# Patient Record
Sex: Female | Born: 1975 | Hispanic: No | Marital: Married | Smoking: Never smoker
Health system: Southern US, Community
[De-identification: ages and names within clinical notes are randomized; demographics above are authoritative.]

## PROBLEM LIST (undated history)

## (undated) ENCOUNTER — Ambulatory Visit: Discharge status: Absent without leave

## (undated) DIAGNOSIS — G43909 Migraine, unspecified, not intractable, without status migrainosus: Secondary | ICD-10-CM

## (undated) HISTORY — DX: Migraine, unspecified, not intractable, without status migrainosus: G43.909

---

## 2005-12-28 ENCOUNTER — Emergency Department (HOSPITAL_COMMUNITY): Admission: EM | Admit: 2005-12-28 | Discharge: 2005-12-28 | Payer: Self-pay | Admitting: Emergency Medicine

## 2010-01-26 HISTORY — PX: LIPOSUCTION: SHX10

## 2010-11-30 ENCOUNTER — Ambulatory Visit: Payer: PRIVATE HEALTH INSURANCE | Admitting: *Deleted

## 2010-11-30 VITALS — BP 117/79 | Wt 145.0 lb

## 2010-11-30 DIAGNOSIS — O219 Vomiting of pregnancy, unspecified: Secondary | ICD-10-CM

## 2010-11-30 DIAGNOSIS — O3680X0 Pregnancy with inconclusive fetal viability, not applicable or unspecified: Secondary | ICD-10-CM

## 2010-11-30 MED ORDER — ONDANSETRON HCL 4 MG PO TABS: 4.0000 mg | ORAL_TABLET | Freq: Three times a day (TID) | ORAL | Status: AC | PRN

## 2010-11-30 NOTE — Patient Instructions (Signed)
Go to scheduled ultrasound and follow up afterwards in our office for new ob appointment.

## 2010-11-30 NOTE — Progress Notes (Signed)
Patient presents approximately [redacted] weeks pregnant according to bedside ultrasound.  She had had a small amount of spotting in August and did not think that this was her period.  Her last normal cycle was in July.  [facility].  There was a positve fetal heart rate on u/s today in the office.  Patient was given information on first trimester screening and given information about her upcoming visits.  She will follow up after the u/s for her New OB visit.

## 2010-12-01 LAB — OBSTETRIC PANEL
Basophils Relative: 0 % (ref 0–1)
Hemoglobin: 12.9 g/dL (ref 12.0–15.0)
Hepatitis B Surface Ag: NEGATIVE
Lymphocytes Relative: 27 % (ref 12–46)
Lymphs Abs: 2.6 10*3/uL (ref 0.7–4.0)
Monocytes Relative: 5 % (ref 3–12)
Neutro Abs: 6.5 10*3/uL (ref 1.7–7.7)
Neutrophils Relative %: 68 % (ref 43–77)
RBC: 4.48 MIL/uL (ref 3.87–5.11)
Rh Type: POSITIVE
Rubella: 92.6 IU/mL — ABNORMAL HIGH
WBC: 9.7 10*3/uL (ref 4.0–10.5)

## 2010-12-01 LAB — SICKLE CELL SCREEN: Sickle Cell Screen: NEGATIVE

## 2010-12-07 ENCOUNTER — Ambulatory Visit (HOSPITAL_COMMUNITY): Payer: PRIVATE HEALTH INSURANCE

## 2010-12-07 ENCOUNTER — Ambulatory Visit (HOSPITAL_COMMUNITY)
Admission: RE | Admit: 2010-12-07 | Discharge: 2010-12-07 | Disposition: A | Discharge status: Home / Self Care | Payer: Non-veteran care | Source: Ambulatory Visit | Attending: Obstetrics & Gynecology | Admitting: Obstetrics & Gynecology

## 2010-12-07 DIAGNOSIS — O3680X0 Pregnancy with inconclusive fetal viability, not applicable or unspecified: Secondary | ICD-10-CM

## 2010-12-07 DIAGNOSIS — Z3689 Encounter for other specified antenatal screening: Secondary | ICD-10-CM | POA: Insufficient documentation

## 2010-12-13 ENCOUNTER — Ambulatory Visit (INDEPENDENT_AMBULATORY_CARE_PROVIDER_SITE_OTHER): Payer: PRIVATE HEALTH INSURANCE | Admitting: Family Medicine

## 2010-12-13 ENCOUNTER — Encounter: Payer: Self-pay | Admitting: Family Medicine

## 2010-12-13 DIAGNOSIS — Z1272 Encounter for screening for malignant neoplasm of vagina: Secondary | ICD-10-CM

## 2010-12-13 DIAGNOSIS — O09529 Supervision of elderly multigravida, unspecified trimester: Secondary | ICD-10-CM

## 2010-12-13 DIAGNOSIS — Z113 Encounter for screening for infections with a predominantly sexual mode of transmission: Secondary | ICD-10-CM

## 2010-12-13 DIAGNOSIS — Z3480 Encounter for supervision of other normal pregnancy, unspecified trimester: Secondary | ICD-10-CM | POA: Insufficient documentation

## 2010-12-13 DIAGNOSIS — IMO0002 Reserved for concepts with insufficient information to code with codable children: Secondary | ICD-10-CM

## 2010-12-13 DIAGNOSIS — Z3400 Encounter for supervision of normal first pregnancy, unspecified trimester: Secondary | ICD-10-CM

## 2010-12-13 MED ORDER — PROMETHAZINE HCL 25 MG PO TABS: 25.0000 mg | ORAL_TABLET | Freq: Four times a day (QID) | ORAL | Status: DC | PRN

## 2010-12-13 NOTE — Patient Instructions (Signed)
Pregnancy - First Trimester During sexual intercourse, millions of sperm go into the vagina. Only 1 sperm will penetrate and fertilize the female egg while it is in the Fallopian tube. One week later, the fertilized egg implants into the wall of the uterus. An embryo begins to develop into a baby. At 6 to 8 weeks, the eyes and face are formed and the heartbeat can be seen on ultrasound. At the end of 12 weeks (first trimester), all the baby's organs are formed. Now that you are pregnant, you will want to do everything you can to have a healthy baby. Two of the most important things are to get good prenatal care and follow your caregiver's instructions. Prenatal care is all the medical care you receive before the baby's birth. It is given to prevent, find and treat problems during the pregnancy and childbirth. PRENATAL EXAMS:  During prenatal visits, your weight, blood pressure and urine are checked. This is done to make sure you are healthy and progressing normally during the pregnancy.   A pregnant woman should gain 25 to 35 pounds during the pregnancy. However, if you are over weight or underweight, your caregiver will advise you regarding your weight.   Your caregiver will ask and answer questions for you.   Blood work, cervical cultures, other necessary tests and a Pap test are done during your prenatal exams. These tests are done to check on your health and the probable health of your baby. Tests are strongly recommended and done for HIV with your permission. This is the virus that causes AIDS. These tests are done because medications can be given to help prevent your baby from being born with this infection should you have been infected without knowing it. Blood work is also used to find out your blood type, previous infections and follow your blood levels (hemoglobin).   Low hemoglobin (anemia) is common during pregnancy. Iron and vitamins are given to help prevent this. Later in the pregnancy,  blood tests for diabetes will be done along with any other tests if any problems develop. You may need tests to make sure you and the baby are doing well.   You may need other tests to make sure you and the baby are doing well.  CHANGES DURING THE FIRST TRIMESTER (THE FIRST 3 MONTHS OF PREGNANCY) Your body goes through many changes during pregnancy. They vary from person to person. Talk to your caregiver about changes you notice and are concerned about. Changes can include:  Your menstrual period stops.   The egg and sperm carry the genes that determine what you look like. Genes from you and your partner are forming a baby. The female genes determine whether the baby is a boy or a girl.   Your body increases in girth and you may feel bloated.   Feeling sick to your stomach (nauseous) and throwing up (vomiting). If the vomiting is uncontrollable, call your caregiver.   Your breasts will begin to enlarge and become tender.   Your nipples may stick out more and become darker.   The need to urinate more. Painful urination may mean you have a bladder infection.   Tiring easily.   Loss of appetite.   Cravings for certain kinds of food.   At first, you may gain or lose a couple of pounds.   You may have changes in your emotions from day to day (excited to be pregnant or concerned something may go wrong with the pregnancy and baby).     You may have more vivid and strange dreams.  HOME CARE INSTRUCTIONS  It is very important to avoid all smoking, alcohol and un-prescribed drugs during your pregnancy. These affect the formation and growth of the baby. Avoid chemicals while pregnant to ensure the delivery of a healthy infant.   Start your prenatal visits by the 12th week of pregnancy. They are usually scheduled monthly at first, then more often in the last 2 months before delivery. Keep your caregiver's appointments. Follow your caregiver's instructions regarding medication use, blood and lab  tests, exercise, and diet.   During pregnancy, you are providing food for you and your baby. Eat regular, well-balanced meals. Choose foods such as meat, fish, milk and other low fat dairy products, vegetables, fruits, and whole-grain breads and cereals. Your caregiver will tell you of the ideal weight gain.   You can help morning sickness by keeping soda crackers (saltines) at the bedside. Eat a couple before arising in the morning. You may want to use the crackers without salt on them.   Eating 4 to 5 small meals rather than 3 large meals a day also may help the nausea and vomiting.   Drinking liquids between meals instead of during meals also seems to help nausea and vomiting.   A physical sexual relationship may be continued throughout pregnancy if there are no other problems. Problems may be early (premature) leaking of amniotic fluid from the membranes, vaginal bleeding, or belly (abdominal) pain.   Exercise regularly if there are no restrictions. Check with your caregiver or physical therapist if you are unsure of the safety of some of your exercises. Greater weight gain will occur in the last 2 trimesters of pregnancy. Exercising will help:   Control your weight.   Keep you in shape.   Prepare you for labor and delivery.   Help you lose your pregnancy weight after you deliver your baby.   Wear a good support or jogging bra for breast tenderness during pregnancy. This may help if worn during sleep too.   Ask when prenatal classes are available. Begin classes when they are offered.   Do not use hot tubs, steam rooms or saunas.   Wear your seat belt when driving. This protects you and your baby if you are in an accident.   Avoid raw meat, uncooked cheese, cat litter boxes and soil used by cats throughout the pregnancy. These carry germs that can cause birth defects in the baby.   The first trimester is a good time to visit your dentist for your dental health. Getting your teeth  cleaned is OK. Use a softer toothbrush and brush gently during pregnancy.   Ask for help if you have financial, counseling or nutritional needs during pregnancy. Your caregiver will be able to offer counseling for these needs as well as refer you for other special needs.   Do not take any medications or herbs unless told by your caregiver.   Inform your caregiver if there is any mental or physical domestic violence.   Make a list of emergency phone numbers of family, friends, hospital, police and fire department.   Write down your questions. Take them to your prenatal visit.   Do not douche.   Do not cross your legs.   If you have to stand for long periods of time, rotate you feet or take small steps in a circle.   You may have more vaginal secretions that may require a sanitary pad. Do not use tampons   or scented sanitary pads.  MEDICATIONS AND DRUG USE IN PREGNANCY  Take prenatal vitamins as directed. The vitamin should contain 1 milligram of folic acid. Keep all vitamins out of reach of children. Only a couple vitamins or tablets containing iron may be fatal to a baby or young child when ingested.   Avoid use of all medications, including herbs, over-the-counter medications, not prescribed or suggested by your caregiver. Only take over-the-counter or prescription medicines for pain, discomfort, or fever as directed by your caregiver. Do not use aspirin, ibuprofen (Motrin, Advil, Nuprin) or naproxen (Aleve) unless OK'd by your caregiver.   Let your caregiver also know about herbs you may be using.   Alcohol is related to a number of birth defects. This includes fetal alcohol syndrome. All alcohol, in any form, should be avoided completely. Smoking will cause low birth rate and premature babies.   Street/illegal drugs are very harmful to the baby. They are absolutely forbidden. A baby born to an addicted mother will be addicted at birth. The baby will go through the same withdrawal  an adult does.   Let your caregiver know about any medications that you have to take and for what reason you take them.  MISCARRIAGE IS COMMON DURING PREGNANCY A miscarriage does not mean you did something wrong. It is not a reason to worry about getting pregnant again. Your caregiver will help you with questions you may have. If you have a miscarriage, you may need minor surgery (a D & C). SEEK MEDICAL CARE IF:  You have any concerns or worries during your pregnancy. It is better to call with your questions if you feel they cannot wait, rather than worry about them.  SEEK IMMEDIATE MEDICAL CARE IF:  An unexplained oral temperature above 101 develops, or as your caregiver suggests.   You have leaking of fluid from the vagina (birth canal). If leaking membranes are suspected, take your temperature and inform your caregiver of this when you call.   There is vaginal spotting or bleeding. Notify your caregiver of the amount and how many pads are used.   You develop a bad smelling vaginal discharge with a change in the color.   You continue to feel sick to your stomach (nauseated) and have no relief from remedies suggested. You vomit blood or coffee ground like materials.   You lose more than 2 pounds of weight in one week.   You gain more than 2 pounds of weight in a week and you notice swelling of your face, hands, feet or legs.   You gain 5 pounds or more in 1 week (even if you do not have swelling of your hands, face, legs or feet).   You get exposed to German measles and have never had them.   You are exposed to fifth disease or chicken pox.   You develop belly (abdominal) pain. Round ligament discomfort is a common non-cancerous (benign) cause of abdominal pain in pregnancy. Your caregiver still must evaluate this.   You develop headache, fever, diarrhea, pain with urination, or shortness of breath.   You fall, are in a car accident or have any kind of trauma.   There is mental  or physical violence in your home.  Document Released: 02/06/2001 Document Re-Released: 08/02/2009 [employer] Patient Information 2011 [employer]. 

## 2010-12-13 NOTE — Progress Notes (Signed)
   Subjective:    Tanya Maxwell is a G2P0010 [redacted]w[redacted]d being seen today for her first obstetrical visit.  Her obstetrical history is significant for advanced maternal age. Patient does intend to breast feed. Pregnancy history fully reviewed.  Patient reports fatigue.  Filed Vitals:   12/13/10 0900  BP: 113/68  Weight: 144 lb (65.318 kg)    HISTORY: OB History    Grav Para Term Preterm Abortions TAB SAB Ect Mult Living   2 0 0 0 1 1 0 0 0 0      # Outc Date GA Lbr Len/2nd Wgt Sex Del Anes PTL Lv   1 TAB            2 CUR              Past Medical History  Diagnosis Date  . Migraines     since 1996   Past Surgical History  Procedure Date  . Liposuction 01/2010   Family History  Problem Relation Age of Onset  . Heart disease Mother   . Diabetes Mother      Exam    Uterine Size: size equals dates  Pelvic Exam:    Perineum: Normal Perineum   Vulva: normal   Vagina:  normal mucosa       Cervix: nulliparous appearance   Adnexa: normal adnexa   Bony Pelvis: gynecoid  System: Breast:  normal appearance, no masses or tenderness   Skin: normal coloration and turgor, no rashes    Neurologic: oriented, normal   Extremities: normal strength, tone, and muscle mass, no erythema, induration, or nodules   HEENT sclera clear, anicteric   Mouth/Teeth mucous membranes moist, pharynx normal without lesions   Neck supple   Cardiovascular: regular rate and rhythm   Respiratory:  appears well, vitals normal, no respiratory distress, acyanotic, normal RR, ear and throat exam is normal, neck free of mass or lymphadenopathy, chest clear, no wheezing, crepitations, rhonchi, normal symmetric air entry   Abdomen: soft, non-tender; bowel sounds normal; no masses,  no organomegaly   Urinary: urethral meatus normal      Assessment:    Pregnancy: G2P0010 Patient Active Problem List  Diagnoses  . Supervision of normal first pregnancy  . Advanced maternal age        Plan:       Initial labs drawn. Prenatal vitamins. Problem list reviewed and updated. Genetic Screening discussed First Screen: discussed.  Ultrasound discussed; fetal survey: ordered.  Follow up in 4 weeks.   Tanya Maxwell 12/13/2010 Considering first screen---will call back and let us know.

## 2010-12-13 NOTE — Progress Notes (Signed)
Addended by: Reva Bores on: 12/13/2010 11:58 AM   Modules accepted: Orders

## 2010-12-18 ENCOUNTER — Other Ambulatory Visit: Payer: Self-pay | Admitting: Obstetrics & Gynecology

## 2010-12-18 ENCOUNTER — Other Ambulatory Visit: Payer: PRIVATE HEALTH INSURANCE

## 2010-12-18 DIAGNOSIS — Z3682 Encounter for antenatal screening for nuchal translucency: Secondary | ICD-10-CM

## 2010-12-26 ENCOUNTER — Ambulatory Visit (INDEPENDENT_AMBULATORY_CARE_PROVIDER_SITE_OTHER): Payer: PRIVATE HEALTH INSURANCE | Admitting: Nurse Practitioner

## 2010-12-26 ENCOUNTER — Encounter: Payer: Self-pay | Admitting: Nurse Practitioner

## 2010-12-26 DIAGNOSIS — F419 Anxiety disorder, unspecified: Secondary | ICD-10-CM

## 2010-12-26 DIAGNOSIS — M542 Cervicalgia: Secondary | ICD-10-CM

## 2010-12-26 DIAGNOSIS — F411 Generalized anxiety disorder: Secondary | ICD-10-CM

## 2010-12-26 DIAGNOSIS — G43109 Migraine with aura, not intractable, without status migrainosus: Secondary | ICD-10-CM | POA: Insufficient documentation

## 2010-12-26 MED ORDER — SERTRALINE HCL 50 MG PO TABS: 50.0000 mg | ORAL_TABLET | Freq: Every day | ORAL | Status: DC

## 2010-12-26 MED ORDER — PROMETHAZINE HCL 25 MG PO TABS: 25.0000 mg | ORAL_TABLET | Freq: Four times a day (QID) | ORAL | Status: DC | PRN

## 2010-12-26 NOTE — Progress Notes (Signed)
Diagnosis:Migraine with Aura   History: Pt is in office today for evaluation of migraine headache. She is also pregnant [redacted] weeks and 1 day. G2 [id number]. She has had migraine for the last 16 years. Her aura is visual. It has been worse with BCPs in past. She does not feel it is any better or worse with pregnancy. She does have ADHD and insomnia. She also describes herself as multitasker and  Somewhat anxious. She has not been exercising lately. In past medications have included Goody Powders, Imitrex, Maxalt, Zomig, Topamax and Lexapro. 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  Location:Right and left temples, Occipital and neck  Number of Headache days/month: Severe:2-3 that last 2-3 days Moderate:included with 2-3 days Mild:0  Current Outpatient Prescriptions on File Prior to Visit  Medication Sig Dispense Refill  . Prenatal Vitamins (DIS) TABS Take 1 tablet by mouth daily.          Acute prevention:Goody Powders, Tylenol  Past Medical History  Diagnosis Date  . Migraines     since 1996   Past Surgical History  Procedure Date  . Liposuction 01/2010   Family History  Problem Relation Age of Onset  . Heart disease Mother   . Diabetes Mother    Social History:  reports that she has never smoked. She has never used smokeless tobacco. She reports that she does not drink alcohol or use illicit drugs. Allergies: No Known Allergies  Triggers:Stress, not eating , Artifical sweetners  Birth control:pregnancy  WUJ:[id number] for pregnancy, nausea, headache, anxiety  Exam:Well developed well nourished female  General:NAD HEENT:negative Cardiac:RRR Lungs:Clear Neuro:Negative Skin:Warm and dry Muscles: Tight in neck and Traps  Procedure: 4cc lidocaine, 4 cc marcaine, 2 cc dexamethazone. Injected with 1cc each site in Bil Occipital, Bil Temple and bilateral traps. Pt tolerated procedure well.    Impression:migraine - classic in pregnancy  Plan We discussed the risk and benefit ratio in using medications in pregnant women. She has agreed the risk outweight the risks for her. We will start Zoloft for her anxiety and hopefully that will help with that migraine trigger. We did Trigger Point Injections today and she is asked to ice tonight. We will refer her to [facility] for eval and treatment of headaches and muscle spasms in pregnancy. She is asked to exercise when she feels able. We will see her back in 3 weeks.  Time Spent:one hour

## 2011-01-01 ENCOUNTER — Ambulatory Visit (HOSPITAL_COMMUNITY)
Admission: RE | Admit: 2011-01-01 | Discharge: 2011-01-01 | Disposition: A | Discharge status: Home / Self Care | Payer: Non-veteran care | Source: Ambulatory Visit | Attending: Obstetrics & Gynecology | Admitting: Obstetrics & Gynecology

## 2011-01-01 ENCOUNTER — Other Ambulatory Visit: Payer: Self-pay

## 2011-01-01 DIAGNOSIS — O3510X0 Maternal care for (suspected) chromosomal abnormality in fetus, unspecified, not applicable or unspecified: Secondary | ICD-10-CM | POA: Insufficient documentation

## 2011-01-01 DIAGNOSIS — O09529 Supervision of elderly multigravida, unspecified trimester: Secondary | ICD-10-CM | POA: Insufficient documentation

## 2011-01-01 DIAGNOSIS — O351XX0 Maternal care for (suspected) chromosomal abnormality in fetus, not applicable or unspecified: Secondary | ICD-10-CM | POA: Insufficient documentation

## 2011-01-01 DIAGNOSIS — Z3689 Encounter for other specified antenatal screening: Secondary | ICD-10-CM | POA: Insufficient documentation

## 2011-01-01 DIAGNOSIS — Z3682 Encounter for antenatal screening for nuchal translucency: Secondary | ICD-10-CM

## 2011-01-09 ENCOUNTER — Encounter: Payer: Self-pay | Admitting: Nurse Practitioner

## 2011-01-09 ENCOUNTER — Ambulatory Visit (INDEPENDENT_AMBULATORY_CARE_PROVIDER_SITE_OTHER): Payer: PRIVATE HEALTH INSURANCE | Admitting: Nurse Practitioner

## 2011-01-09 VITALS — BP 108/66 | HR 71 | Wt 142.0 lb

## 2011-01-09 DIAGNOSIS — G43909 Migraine, unspecified, not intractable, without status migrainosus: Secondary | ICD-10-CM

## 2011-01-09 DIAGNOSIS — O2690 Pregnancy related conditions, unspecified, unspecified trimester: Secondary | ICD-10-CM

## 2011-01-09 MED ORDER — HYDROCODONE-ACETAMINOPHEN 7.5-750 MG PO TABS: 1.0000 | ORAL_TABLET | Freq: Four times a day (QID) | ORAL | Status: DC | PRN

## 2011-01-09 MED ORDER — SUMATRIPTAN SUCCINATE 100 MG PO TABS: 100.0000 mg | ORAL_TABLET | Freq: Once | ORAL | Status: DC | PRN

## 2011-01-09 NOTE — Progress Notes (Signed)
S: Pt returns today for follow up on migraine headaches in pregnancy. She is 13 weeks and 1 day pregnant. On her last office visit  12/26/10 we did trigger point injections which she did not feel were helpful. Her headache came back and lasted for 4 days. Now she feels her headaches are back to there usual pattern.  She is not sure if Zoloft has helped her anxiety. She has been doing limited exercise and had not been called by [facility]. She is using Phenergan 1/2 tab for sleep. She know she did better overall with her anxiety when she was on Ritalin.   O: Alert oriented, NAD HEENT neg Neuro : negative  A: migraine in pregnancy  P: We will not do further Trigger Point Injections We discussed taking Imitrex in Pregnancy and she is willing to assume risk/ discussed Pregnancy Registry She will be given # 30 [place] for rescue Call [facility] and get an appointment Discussed Yoga in pregnancy and other exercise that may help to calm her down Return 1 month or prn

## 2011-01-16 ENCOUNTER — Ambulatory Visit (INDEPENDENT_AMBULATORY_CARE_PROVIDER_SITE_OTHER): Payer: PRIVATE HEALTH INSURANCE | Admitting: Family Medicine

## 2011-01-16 DIAGNOSIS — Z3400 Encounter for supervision of normal first pregnancy, unspecified trimester: Secondary | ICD-10-CM

## 2011-01-16 NOTE — Progress Notes (Signed)
Nml First screen, schedule anatomy

## 2011-01-31 ENCOUNTER — Telehealth: Payer: Self-pay

## 2011-01-31 NOTE — Telephone Encounter (Signed)
PATIENT IS CALLING SHE IS NOW HAVING HEADACHES EVERYDAY AS OPPOSE TO ONCE IN A WHILE. THE PAIN IS CONSISTENT AND DOES NOT GO AWAY. IT IS THE SAME PAIN ALWAYS IT IS NOT DIFFERENT. SHE DOES NOT KNOW IF HER HEADACHES ARE WORSE DUE TO HER PREGNANCY BUT SHE IS HAVING THEM DAILY NOW. PLEASE LET ME KNOW WHAT TO TELL HER OR SEND IT TO A NURSE IF SHE NEEDS SOMETHING CALLED IN TO HER PHARMACY. [facility] THANKS!

## 2011-02-01 ENCOUNTER — Other Ambulatory Visit: Payer: Self-pay | Admitting: Gynecology

## 2011-02-01 DIAGNOSIS — F419 Anxiety disorder, unspecified: Secondary | ICD-10-CM

## 2011-02-01 MED ORDER — PREDNISONE (PAK) 10 MG PO TABS: 20.0000 mg | ORAL_TABLET | Freq: Every day | ORAL | Status: AC

## 2011-02-01 MED ORDER — SERTRALINE HCL 50 MG PO TABS: 100.0000 mg | ORAL_TABLET | Freq: Every day | ORAL | Status: DC

## 2011-02-06 ENCOUNTER — Telehealth: Payer: Self-pay

## 2011-02-06 DIAGNOSIS — F419 Anxiety disorder, unspecified: Secondary | ICD-10-CM

## 2011-02-06 NOTE — Telephone Encounter (Signed)
Patient needs [place] to send [facility] please, I can send it if you can print it out for me and provide the fax, thanks!

## 2011-02-13 ENCOUNTER — Ambulatory Visit (INDEPENDENT_AMBULATORY_CARE_PROVIDER_SITE_OTHER): Payer: PRIVATE HEALTH INSURANCE | Admitting: Advanced Practice Midwife

## 2011-02-13 ENCOUNTER — Encounter: Payer: PRIVATE HEALTH INSURANCE | Admitting: Nurse Practitioner

## 2011-02-13 ENCOUNTER — Encounter: Payer: Self-pay | Admitting: Advanced Practice Midwife

## 2011-02-13 VITALS — BP 108/64 | Wt 143.0 lb

## 2011-02-13 DIAGNOSIS — Z3480 Encounter for supervision of other normal pregnancy, unspecified trimester: Secondary | ICD-10-CM

## 2011-02-13 MED ORDER — SERTRALINE HCL 100 MG PO TABS: 100.0000 mg | ORAL_TABLET | Freq: Every day | ORAL | Status: DC

## 2011-02-13 NOTE — Patient Instructions (Signed)
Pregnancy - Second Trimester The second trimester of pregnancy (3 to 6 months) is a period of rapid growth for you and your baby. At the end of the sixth month, your baby is about 9 inches long and weighs 1 1/2 pounds. You will begin to feel the baby move between 18 and 20 weeks of the pregnancy. This is called quickening. Weight gain is faster. A clear fluid (colostrum) may leak out of your breasts. You may feel small contractions of the womb (uterus). This is known as false labor or Braxton-Hicks contractions. This is like a practice for labor when the baby is ready to be born. Usually, the problems with morning sickness have usually passed by the end of your first trimester. Some women develop small dark blotches (called cholasma, mask of pregnancy) on their face that usually goes away after the baby is born. Exposure to the sun makes the blotches worse. Acne may also develop in some pregnant women and pregnant women who have acne, may find that it goes away. PRENATAL EXAMS  Blood work may continue to be done during prenatal exams. These tests are done to check on your health and the probable health of your baby. Blood work is used to follow your blood levels (hemoglobin). Anemia (low hemoglobin) is common during pregnancy. Iron and vitamins are given to help prevent this. You will also be checked for diabetes between 24 and 28 weeks of the pregnancy. Some of the previous blood tests may be repeated.   The size of the uterus is measured during each visit. This is to make sure that the baby is continuing to grow properly according to the dates of the pregnancy.   Your blood pressure is checked every prenatal visit. This is to make sure you are not getting toxemia.   Your urine is checked to make sure you do not have an infection, diabetes or protein in the urine.   Your weight is checked often to make sure gains are happening at the suggested rate. This is to ensure that both you and your baby are  growing normally.   Sometimes, an ultrasound is performed to confirm the proper growth and development of the baby. This is a test which bounces harmless sound waves off the baby so your caregiver can more accurately determine due dates.  Sometimes, a specialized test is done on the amniotic fluid surrounding the baby. This test is called an amniocentesis. The amniotic fluid is obtained by sticking a needle into the belly (abdomen). This is done to check the chromosomes in instances where there is a concern about possible genetic problems with the baby. It is also sometimes done near the end of pregnancy if an early delivery is required. In this case, it is done to help make sure the baby's lungs are mature enough for the baby to live outside of the womb. CHANGES OCCURING IN THE SECOND TRIMESTER OF PREGNANCY Your body goes through many changes during pregnancy. They vary from person to person. Talk to your caregiver about changes you notice that you are concerned about.  During the second trimester, you will likely have an increase in your appetite. It is normal to have cravings for certain foods. This varies from person to person and pregnancy to pregnancy.   Your lower abdomen will begin to bulge.   You may have to urinate more often because the uterus and baby are pressing on your bladder. It is also common to get more bladder infections during pregnancy (  pain with urination). You can help this by drinking lots of fluids and emptying your bladder before and after intercourse.   You may begin to get stretch marks on your hips, abdomen, and breasts. These are normal changes in the body during pregnancy. There are no exercises or medications to take that prevent this change.   You may begin to develop swollen and bulging veins (varicose veins) in your legs. Wearing support hose, elevating your feet for 15 minutes, 3 to 4 times a day and limiting salt in your diet helps lessen the problem.    Heartburn may develop as the uterus grows and pushes up against the stomach. Antacids recommended by your caregiver helps with this problem. Also, eating smaller meals 4 to 5 times a day helps.   Constipation can be treated with a stool softener or adding bulk to your diet. Drinking lots of fluids, vegetables, fruits, and whole grains are helpful.   Exercising is also helpful. If you have been very active up until your pregnancy, most of these activities can be continued during your pregnancy. If you have been less active, it is helpful to start an exercise program such as walking.   Hemorrhoids (varicose veins in the rectum) may develop at the end of the second trimester. Warm sitz baths and hemorrhoid cream recommended by your caregiver helps hemorrhoid problems.   Backaches may develop during this time of your pregnancy. Avoid heavy lifting, wear low heal shoes and practice good posture to help with backache problems.   Some pregnant women develop tingling and numbness of their hand and fingers because of swelling and tightening of ligaments in the wrist (carpel tunnel syndrome). This goes away after the baby is born.   As your breasts enlarge, you may have to get a bigger bra. Get a comfortable, cotton, support bra. Do not get a nursing bra until the last month of the pregnancy if you will be nursing the baby.   You may get a dark line from your belly button to the pubic area called the linea nigra.   You may develop rosy cheeks because of increase blood flow to the face.   You may develop spider looking lines of the face, neck, arms and chest. These go away after the baby is born.  HOME CARE INSTRUCTIONS   It is extremely important to avoid all smoking, herbs, alcohol, and unprescribed drugs during your pregnancy. These chemicals affect the formation and growth of the baby. Avoid these chemicals throughout the pregnancy to ensure the delivery of a healthy infant.   Most of your home  care instructions are the same as suggested for the first trimester of your pregnancy. Keep your caregiver's appointments. Follow your caregiver's instructions regarding medication use, exercise and diet.   During pregnancy, you are providing food for you and your baby. Continue to eat regular, well-balanced meals. Choose foods such as meat, fish, milk and other low fat dairy products, vegetables, fruits, and whole-grain breads and cereals. Your caregiver will tell you of the ideal weight gain.   A physical sexual relationship may be continued up until near the end of pregnancy if there are no other problems. Problems could include early (premature) leaking of amniotic fluid from the membranes, vaginal bleeding, abdominal pain, or other medical or pregnancy problems.   Exercise regularly if there are no restrictions. Check with your caregiver if you are unsure of the safety of some of your exercises. The greatest weight gain will occur in the   last 2 trimesters of pregnancy. Exercise will help you:   Control your weight.   Get you in shape for labor and delivery.   Lose weight after you have the baby.   Wear a good support or jogging bra for breast tenderness during pregnancy. This may help if worn during sleep. Pads or tissues may be used in the bra if you are leaking colostrum.   Do not use hot tubs, steam rooms or saunas throughout the pregnancy.   Wear your seat belt at all times when driving. This protects you and your baby if you are in an accident.   Avoid raw meat, uncooked cheese, cat litter boxes and soil used by cats. These carry germs that can cause birth defects in the baby.   The second trimester is also a good time to visit your dentist for your dental health if this has not been done yet. Getting your teeth cleaned is OK. Use a soft toothbrush. Brush gently during pregnancy.   It is easier to loose urine during pregnancy. Tightening up and strengthening the pelvic muscles will  help with this problem. Practice stopping your urination while you are going to the bathroom. These are the same muscles you need to strengthen. It is also the muscles you would use as if you were trying to stop from passing gas. You can practice tightening these muscles up 10 times a set and repeating this about 3 times per day. Once you know what muscles to tighten up, do not perform these exercises during urination. It is more likely to contribute to an infection by backing up the urine.   Ask for help if you have financial, counseling or nutritional needs during pregnancy. Your caregiver will be able to offer counseling for these needs as well as refer you for other special needs.   Your skin may become oily. If so, wash your face with mild soap, use non-greasy moisturizer and oil or cream based makeup.  MEDICATIONS AND DRUG USE IN PREGNANCY  Take prenatal vitamins as directed. The vitamin should contain 1 milligram of folic acid. Keep all vitamins out of reach of children. Only a couple vitamins or tablets containing iron may be fatal to a baby or young child when ingested.   Avoid use of all medications, including herbs, over-the-counter medications, not prescribed or suggested by your caregiver. Only take over-the-counter or prescription medicines for pain, discomfort, or fever as directed by your caregiver. Do not use aspirin.   Let your caregiver also know about herbs you may be using.   Alcohol is related to a number of birth defects. This includes fetal alcohol syndrome. All alcohol, in any form, should be avoided completely. Smoking will cause low birth rate and premature babies.   Street or illegal drugs are very harmful to the baby. They are absolutely forbidden. A baby born to an addicted mother will be addicted at birth. The baby will go through the same withdrawal an adult does.  SEEK MEDICAL CARE IF:  You have any concerns or worries during your pregnancy. It is better to call with  your questions if you feel they cannot wait, rather than worry about them. SEEK IMMEDIATE MEDICAL CARE IF:   An unexplained oral temperature above 102 F (38.9 C) develops, or as your caregiver suggests.   You have leaking of fluid from the vagina (birth canal). If leaking membranes are suspected, take your temperature and tell your caregiver of this when you call.   There   is vaginal spotting, bleeding, or passing clots. Tell your caregiver of the amount and how many pads are used. Light spotting in pregnancy is common, especially following intercourse.   You develop a bad smelling vaginal discharge with a change in the color from clear to white.   You continue to feel sick to your stomach (nauseated) and have no relief from remedies suggested. You vomit blood or coffee ground-like materials.   You lose more than 2 pounds of weight or gain more than 2 pounds of weight over 1 week, or as suggested by your caregiver.   You notice swelling of your face, hands, feet, or legs.   You get exposed to German measles and have never had them.   You are exposed to fifth disease or chickenpox.   You develop belly (abdominal) pain. Round ligament discomfort is a common non-cancerous (benign) cause of abdominal pain in pregnancy. Your caregiver still must evaluate you.   You develop a bad headache that does not go away.   You develop fever, diarrhea, pain with urination, or shortness of breath.   You develop visual problems, blurry, or double vision.   You fall or are in a car accident or any kind of trauma.   There is mental or physical violence at home.  Document Released: 02/06/2001 Document Revised: 10/25/2010 Document Reviewed: 08/11/2008 [employer] Patient Information 2012 [employer]. 

## 2011-02-13 NOTE — Progress Notes (Signed)
Doing well. Has Anatomy US scheduled for tomorrow. Discussed warning signs of PTL or bleeding.

## 2011-02-14 ENCOUNTER — Encounter: Payer: Self-pay | Admitting: Obstetrics & Gynecology

## 2011-02-14 ENCOUNTER — Ambulatory Visit (HOSPITAL_COMMUNITY)
Admission: RE | Admit: 2011-02-14 | Discharge: 2011-02-14 | Disposition: A | Discharge status: Home / Self Care | Payer: Non-veteran care | Source: Ambulatory Visit | Attending: Family Medicine | Admitting: Family Medicine

## 2011-02-14 DIAGNOSIS — Z1389 Encounter for screening for other disorder: Secondary | ICD-10-CM | POA: Insufficient documentation

## 2011-02-14 DIAGNOSIS — O358XX0 Maternal care for other (suspected) fetal abnormality and damage, not applicable or unspecified: Secondary | ICD-10-CM | POA: Insufficient documentation

## 2011-02-14 DIAGNOSIS — Z3400 Encounter for supervision of normal first pregnancy, unspecified trimester: Secondary | ICD-10-CM

## 2011-02-14 DIAGNOSIS — O09529 Supervision of elderly multigravida, unspecified trimester: Secondary | ICD-10-CM | POA: Insufficient documentation

## 2011-02-14 DIAGNOSIS — Z363 Encounter for antenatal screening for malformations: Secondary | ICD-10-CM | POA: Insufficient documentation

## 2011-02-15 ENCOUNTER — Encounter: Payer: Self-pay | Admitting: Family Medicine

## 2011-02-27 NOTE — L&D Delivery Note (Signed)
Delivery Note At 4:50 PM a viable female was delivered via Vaginal, Spontaneous Delivery (Presentation: Right Occiput Anterior).  APGAR: 8, 9; weight pending.   Placenta: Intact, Spontaneous, sent to Pathology, 3V Cord. No complications.    Anesthesia: Epidural  Episiotomy: None Lacerations: 1st degree perineal repaired with a single figure-of-eight stitch with 4'0 vicryl; Periurethral laceration hemostatic and did not require repair. Est. Blood Loss 250 mL.  Mom to postpartum.  Baby to nursery-stable.  Eino Farber [facility] CNM present for delivery.  Chancy Hurter MD  07/21/2011, 5:17 PM

## 2011-02-27 NOTE — L&D Delivery Note (Signed)
I was present for delivery and agree with note above. MUHAMMAD,Evertte Sones  

## 2011-03-06 ENCOUNTER — Encounter: Payer: Self-pay | Admitting: Nurse Practitioner

## 2011-03-06 ENCOUNTER — Ambulatory Visit (INDEPENDENT_AMBULATORY_CARE_PROVIDER_SITE_OTHER): Payer: PRIVATE HEALTH INSURANCE | Admitting: Nurse Practitioner

## 2011-03-06 DIAGNOSIS — M62838 Other muscle spasm: Secondary | ICD-10-CM

## 2011-03-06 DIAGNOSIS — G43909 Migraine, unspecified, not intractable, without status migrainosus: Secondary | ICD-10-CM

## 2011-03-06 MED ORDER — CYCLOBENZAPRINE HCL 10 MG PO TABS: 10.0000 mg | ORAL_TABLET | Freq: Three times a day (TID) | ORAL | Status: DC | PRN

## 2011-03-06 NOTE — Progress Notes (Signed)
S: F/U on migraine headache. Pt is now 21 weeks and 1 day pregnant and doing well with pregnancy. Having boy on 07/16/11. She is having 1-2 migraines per week.  She has stopped her Zoloft, she did not think it was helping her anxiety and felt it maybe giving her headache. She has also stopped taking phenergan routinely and now only takes prn. Rarely takes Vicodin. She did feel the prednisone was helpful for a few days. Admits she has taken goody powders on prn basis even though she knows she should not. Imitrex is most helpful for headaches. Has not been able to get to [facility] yet related to paper work. C/O muscle spasm in neck and has taken flexeril in past.  A: Migraine headache in pregnancy Muscle spasm  P: OK to dc Zoloft. Will trial flexeril prn. Continue Imitrex and Vicodin prn. Encouraged to get to [facility] for muscle spasm. Will follow up on prn basis

## 2011-03-06 NOTE — Patient Instructions (Signed)

## 2011-03-08 ENCOUNTER — Ambulatory Visit (INDEPENDENT_AMBULATORY_CARE_PROVIDER_SITE_OTHER): Payer: PRIVATE HEALTH INSURANCE | Admitting: Obstetrics & Gynecology

## 2011-03-08 VITALS — BP 113/62 | Wt 147.0 lb

## 2011-03-08 DIAGNOSIS — Z3400 Encounter for supervision of normal first pregnancy, unspecified trimester: Secondary | ICD-10-CM

## 2011-03-08 DIAGNOSIS — R109 Unspecified abdominal pain: Secondary | ICD-10-CM

## 2011-03-08 DIAGNOSIS — R1084 Generalized abdominal pain: Secondary | ICD-10-CM

## 2011-03-08 LAB — POCT URINALYSIS DIPSTICK
Bilirubin, UA: NEGATIVE
Glucose, UA: NEGATIVE
Ketones, UA: NEGATIVE
Protein, UA: NEGATIVE
Spec Grav, UA: 1.01

## 2011-03-08 NOTE — Progress Notes (Signed)
She comes in today for an unscheduled visit with the complaint of right mid pain since yesterday. No relief with a heating pad. She denies VB, ROM, or CTXs. Her ultrasound was normal. Her speculum exam reveals a closed cervix and a bimanual exam reveals no adnexal masses. I have advised heating pad and tylenol prn. She can miss her appt next week and RTC in 1 month.

## 2011-03-15 ENCOUNTER — Encounter: Payer: PRIVATE HEALTH INSURANCE | Admitting: Obstetrics & Gynecology

## 2011-04-04 ENCOUNTER — Ambulatory Visit (INDEPENDENT_AMBULATORY_CARE_PROVIDER_SITE_OTHER): Payer: PRIVATE HEALTH INSURANCE | Admitting: Obstetrics and Gynecology

## 2011-04-04 DIAGNOSIS — O09529 Supervision of elderly multigravida, unspecified trimester: Secondary | ICD-10-CM

## 2011-04-04 DIAGNOSIS — Z3400 Encounter for supervision of normal first pregnancy, unspecified trimester: Secondary | ICD-10-CM

## 2011-04-04 DIAGNOSIS — IMO0002 Reserved for concepts with insufficient information to code with codable children: Secondary | ICD-10-CM

## 2011-04-04 NOTE — Progress Notes (Signed)
Patient doing well without complaints. Pelvic pain from one month ago has resolved. 1hr GCT at next visit

## 2011-04-18 ENCOUNTER — Ambulatory Visit (INDEPENDENT_AMBULATORY_CARE_PROVIDER_SITE_OTHER): Payer: PRIVATE HEALTH INSURANCE | Admitting: Obstetrics & Gynecology

## 2011-04-18 DIAGNOSIS — IMO0002 Reserved for concepts with insufficient information to code with codable children: Secondary | ICD-10-CM

## 2011-04-18 DIAGNOSIS — G43109 Migraine with aura, not intractable, without status migrainosus: Secondary | ICD-10-CM

## 2011-04-18 DIAGNOSIS — Z3400 Encounter for supervision of normal first pregnancy, unspecified trimester: Secondary | ICD-10-CM

## 2011-04-18 DIAGNOSIS — O365990 Maternal care for other known or suspected poor fetal growth, unspecified trimester, not applicable or unspecified: Secondary | ICD-10-CM | POA: Insufficient documentation

## 2011-04-18 DIAGNOSIS — O09529 Supervision of elderly multigravida, unspecified trimester: Secondary | ICD-10-CM

## 2011-04-18 LAB — CBC
Platelets: 226 10*3/uL (ref 150–400)
RBC: 4.02 MIL/uL (ref 3.87–5.11)
RDW: 13.4 % (ref 11.5–15.5)
WBC: 7.1 10*3/uL (ref 4.0–10.5)

## 2011-04-18 NOTE — Patient Instructions (Signed)
Breastfeeding BENEFITS OF BREASTFEEDING For the baby  The first milk (colostrum) helps the baby's digestive system function better.   There are antibodies from the mother in the milk that help the baby fight off infections.   The baby has a lower incidence of asthma, allergies, and SIDS (sudden infant death syndrome).   The nutrients in breast milk are better than formulas for the baby and helps the baby's brain grow better.   Babies who breastfeed have less gas, colic, and constipation.  For the mother  Breastfeeding helps develop a very special bond between mother and baby.   It is more convenient, always available at the correct temperature and cheaper than formula feeding.   It burns calories in the mother and helps with losing weight that was gained during pregnancy.   It makes the uterus contract back down to normal size faster and slows bleeding following delivery.   Breastfeeding mothers have a lower risk of developing breast cancer.  NURSE FREQUENTLY  A healthy, full-term baby may breastfeed as often as every hour or space his or her feedings to every 3 hours.   How often to nurse will vary from baby to baby. Watch your baby for signs of hunger, not the clock.   Nurse as often as the baby requests, or when you feel the need to reduce the fullness of your breasts.   Awaken the baby if it has been 3 to 4 hours since the last feeding.   Frequent feeding will help the mother make more milk and will prevent problems like sore nipples and engorgement of the breasts.  BABY'S POSITION AT THE BREAST  Whether lying down or sitting, be sure that the baby's tummy is facing your tummy.   Support the breast with 4 fingers underneath the breast and the thumb above. Make sure your fingers are well away from the nipple and baby's mouth.   Stroke the baby's lips and cheek closest to the breast gently with your finger or nipple.   When the baby's mouth is open wide enough, place  all of your nipple and as much of the dark area around the nipple as possible into your baby's mouth.   Pull the baby in close so the tip of the nose and the baby's cheeks touch the breast during the feeding.  FEEDINGS  The length of each feeding varies from baby to baby and from feeding to feeding.   The baby must suck about 2 to 3 minutes for your milk to get to him or her. This is called a "let down." For this reason, allow the baby to feed on each breast as long as he or she wants. Your baby will end the feeding when he or she has received the right balance of nutrients.   To break the suction, put your finger into the corner of the baby's mouth and slide it between his or her gums before removing your breast from his or her mouth. This will help prevent sore nipples.  REDUCING BREAST ENGORGEMENT  In the first week after your baby is born, you may experience signs of breast engorgement. When breasts are engorged, they feel heavy, warm, full, and may be tender to the touch. You can reduce engorgement if you:   Nurse frequently, every 2 to 3 hours. Mothers who breastfeed early and often have fewer problems with engorgement.   Place light ice packs on your breasts between feedings. This reduces swelling. Wrap the ice packs in a   lightweight towel to protect your skin.   Apply moist hot packs to your breast for 5 to 10 minutes before each feeding. This increases circulation and helps the milk flow.   Gently massage your breast before and during the feeding.   Make sure that the baby empties at least one breast at every feeding before switching sides.   Use a breast pump to empty the breasts if your baby is sleepy or not nursing well. You may also want to pump if you are returning to work or or you feel you are getting engorged.   Avoid bottle feeds, pacifiers or supplemental feedings of water or juice in place of breastfeeding.   Be sure the baby is latched on and positioned properly while  breastfeeding.   Prevent fatigue, stress, and anemia.   Wear a supportive bra, avoiding underwire styles.   Eat a balanced diet with enough fluids.  If you follow these suggestions, your engorgement should improve in 24 to 48 hours. If you are still experiencing difficulty, call your lactation consultant or caregiver. IS MY BABY GETTING ENOUGH MILK? Sometimes, mothers worry about whether their babies are getting enough milk. You can be assured that your baby is getting enough milk if:  The baby is actively sucking and you hear swallowing.   The baby nurses at least 8 to 12 times in a 24 hour time period. Nurse your baby until he or she unlatches or falls asleep at the first breast (at least 10 to 20 minutes), then offer the second side.   The baby is wetting 5 to 6 disposable diapers (6 to 8 cloth diapers) in a 24 hour period by 5 to 6 days of age.   The baby is having at least 2 to 3 stools every 24 hours for the first few months. Breast milk is all the food your baby needs. It is not necessary for your baby to have water or formula. In fact, to help your breasts make more milk, it is best not to give your baby supplemental feedings during the early weeks.   The stool should be soft and yellow.   The baby should gain 4 to 7 ounces per week after he is 4 days old.  TAKE CARE OF YOURSELF Take care of your breasts by:  Bathing or showering daily.   Avoiding the use of soaps on your nipples.   Start feedings on your left breast at one feeding and on your right breast at the next feeding.   You will notice an increase in your milk supply 2 to 5 days after delivery. You may feel some discomfort from engorgement, which makes your breasts very firm and often tender. Engorgement "peaks" out within 24 to 48 hours. In the meantime, apply warm moist towels to your breasts for 5 to 10 minutes before feeding. Gentle massage and expression of some milk before feeding will soften your breasts, making  it easier for your baby to latch on. Wear a well fitting nursing bra and air dry your nipples for 10 to 15 minutes after each feeding.   Only use cotton bra pads.   Only use pure lanolin on your nipples after nursing. You do not need to wash it off before nursing.  Take care of yourself by:   Eating well-balanced meals and nutritious snacks.   Drinking milk, fruit juice, and water to satisfy your thirst (about 8 glasses a day).   Getting plenty of rest.   Increasing calcium in   your diet (1200 mg a day).   Avoiding foods that you notice affect the baby in a bad way.  SEEK MEDICAL CARE IF:   You have any questions or difficulty with breastfeeding.   You need help.   You have a hard, red, sore area on your breast, accompanied by a fever of 100.5 F (38.1 C) or more.   Your baby is too sleepy to eat well or is having trouble sleeping.   Your baby is wetting less than 6 diapers per day, by 5 days of age.   Your baby's skin or white part of his or her eyes is more yellow than it was in the hospital.   You feel depressed.  Document Released: 02/12/2005 Document Revised: 10/25/2010 Document Reviewed: 09/27/2008 [employer] Patient Information 2012 [employer]. 

## 2011-04-18 NOTE — Progress Notes (Signed)
1 hr GTT, third trimester labs today.  Will get ultrasound for size less than dates.  No other complaints or concerns.  Fetal movement and labor precautions reviewed.

## 2011-04-19 ENCOUNTER — Encounter: Payer: Self-pay | Admitting: Obstetrics & Gynecology

## 2011-04-19 LAB — RPR

## 2011-04-19 LAB — HIV ANTIBODY (ROUTINE TESTING W REFLEX): HIV: NONREACTIVE

## 2011-04-19 LAB — GLUCOSE TOLERANCE, 1 HOUR: Glucose, 1 Hour GTT: 117 mg/dL (ref 70–140)

## 2011-04-25 ENCOUNTER — Ambulatory Visit (HOSPITAL_COMMUNITY): Admission: RE | Admit: 2011-04-25 | Payer: PRIVATE HEALTH INSURANCE | Source: Ambulatory Visit

## 2011-04-25 ENCOUNTER — Ambulatory Visit (HOSPITAL_COMMUNITY)
Admission: RE | Admit: 2011-04-25 | Discharge: 2011-04-25 | Disposition: A | Discharge status: Home / Self Care | Payer: PRIVATE HEALTH INSURANCE | Source: Ambulatory Visit | Attending: Obstetrics & Gynecology | Admitting: Obstetrics & Gynecology

## 2011-04-25 DIAGNOSIS — O09529 Supervision of elderly multigravida, unspecified trimester: Secondary | ICD-10-CM | POA: Insufficient documentation

## 2011-04-25 DIAGNOSIS — O365990 Maternal care for other known or suspected poor fetal growth, unspecified trimester, not applicable or unspecified: Secondary | ICD-10-CM | POA: Insufficient documentation

## 2011-05-02 ENCOUNTER — Ambulatory Visit (INDEPENDENT_AMBULATORY_CARE_PROVIDER_SITE_OTHER): Payer: PRIVATE HEALTH INSURANCE | Admitting: Obstetrics & Gynecology

## 2011-05-02 DIAGNOSIS — F411 Generalized anxiety disorder: Secondary | ICD-10-CM

## 2011-05-02 DIAGNOSIS — F419 Anxiety disorder, unspecified: Secondary | ICD-10-CM

## 2011-05-02 DIAGNOSIS — O09529 Supervision of elderly multigravida, unspecified trimester: Secondary | ICD-10-CM

## 2011-05-02 DIAGNOSIS — IMO0002 Reserved for concepts with insufficient information to code with codable children: Secondary | ICD-10-CM

## 2011-05-02 DIAGNOSIS — O365990 Maternal care for other known or suspected poor fetal growth, unspecified trimester, not applicable or unspecified: Secondary | ICD-10-CM

## 2011-05-02 DIAGNOSIS — Z3400 Encounter for supervision of normal first pregnancy, unspecified trimester: Secondary | ICD-10-CM

## 2011-05-02 NOTE — Progress Notes (Signed)
U/S done 2/27 showed EFW 1297g/62%, AFI 16.19, cervix 3.2 cm.  Normal 1 hr GTT, normal labs.  No other complaints or concerns.  Fetal movement and labor precautions reviewed.

## 2011-05-02 NOTE — Patient Instructions (Signed)
Return to clinic for any obstetric concerns or go to [facility] for evaluation  

## 2011-05-15 ENCOUNTER — Ambulatory Visit (INDEPENDENT_AMBULATORY_CARE_PROVIDER_SITE_OTHER): Payer: PRIVATE HEALTH INSURANCE | Admitting: Family Medicine

## 2011-05-15 DIAGNOSIS — Z3480 Encounter for supervision of other normal pregnancy, unspecified trimester: Secondary | ICD-10-CM

## 2011-05-15 NOTE — Patient Instructions (Addendum)
Preterm Labor Preterm labor is when labor starts at less than 37 weeks of pregnancy. The normal length of a pregnancy is 39 to 41 weeks. CAUSES Often, there is no identifiable underlying cause as to why a woman goes into preterm labor. However, one of the most common known causes of preterm labor is infection. Infections of the uterus, cervix, vagina, amniotic sac, bladder, kidney, or even the lungs (pneumonia) can cause labor to start. Other causes of preterm labor include:  Urogenital infections, such as yeast infections and bacterial vaginosis.   Uterine abnormalities (uterine shape, uterine septum, fibroids, bleeding from the placenta).   A cervix that has been operated on and opens prematurely.   Malformations in the baby.   Multiple gestations (twins, triplets, and so on).   Breakage of the amniotic sac.  Additional risk factors for preterm labor include:  Previous history of preterm labor.   Premature rupture of membranes (PROM).   A placenta that covers the opening of the cervix (placenta previa).   A placenta that separates from the uterus (placenta abruption).   A cervix that is too weak to hold the baby in the uterus (incompetence cervix).   Having too much fluid in the amniotic sac (polyhydramnios).   Taking illegal drugs or smoking while pregnant.   Not gaining enough weight while pregnant.   Women younger than 18 and older than 35 years old.   Low socioeconomic status.   African-American ethnicity.  SYMPTOMS Signs and symptoms of preterm labor include:  Menstrual-like cramps.   Contractions that are 30 to 70 seconds apart, become very regular, closer together, and are more intense and painful.   Contractions that start on the top of the uterus and spread down to the lower abdomen and back.   A sense of increased pelvic pressure or back pain.   A watery or bloody discharge that comes from the vagina.  DIAGNOSIS  A diagnosis can be confirmed by:  A  vaginal exam.   An ultrasound of the cervix.   Sampling (swabbing) cervico-vaginal secretions. These samples can be tested for the presence of fetal fibronectin. This is a protein found in cervical discharge which is associated with preterm labor.   Fetal monitoring.  TREATMENT  Depending on the length of the pregnancy and other circumstances, a caregiver may suggest bed rest. If necessary, there are medicines that can be given to stop contractions and to quicken fetal lung maturity. If labor happens before 34 weeks of pregnancy, a prolonged hospital stay may be recommended. Treatment depends on the condition of both the mother and baby. PREVENTION There are some things a mother can do to lower the risk of preterm labor in future pregnancies. A woman can:   Stop smoking.   Maintain healthy weight gain and avoid chemicals and drugs that are not necessary.   Be watchful for any type of infection.   Inform her caregiver if she has a known history of preterm labor.  Document Released: 05/05/2003 Document Revised: 02/01/2011 Document Reviewed: 06/09/2010 [employer] Patient Information 2012 [employer]. Pregnancy - Third Trimester The third trimester of pregnancy (the last 3 months) is a period of the most rapid growth for you and your baby. The baby approaches a length of 20 inches and a weight of 6 to 10 pounds. The baby is adding on fat and getting ready for life outside your body. While inside, babies have periods of sleeping and waking, suck their thumbs, and hiccups. You can often feel small   contractions of the uterus. This is false labor. It is also called Braxton-Hicks contractions. This is like a practice for labor. The usual problems in this stage of pregnancy include more difficulty breathing, swelling of the hands and feet from water retention, and having to urinate more often because of the uterus and baby pressing on your bladder.  PRENATAL EXAMS  Blood work may continue to be  done during prenatal exams. These tests are done to check on your health and the probable health of your baby. Blood work is used to follow your blood levels (hemoglobin). Anemia (low hemoglobin) is common during pregnancy. Iron and vitamins are given to help prevent this. You may also continue to be checked for diabetes. Some of the past blood tests may be done again.   The size of the uterus is measured during each visit. This makes sure your baby is growing properly according to your pregnancy dates.   Your blood pressure is checked every prenatal visit. This is to make sure you are not getting toxemia.   Your urine is checked every prenatal visit for infection, diabetes and protein.   Your weight is checked at each visit. This is done to make sure gains are happening at the suggested rate and that you and your baby are growing normally.   Sometimes, an ultrasound is performed to confirm the position and the proper growth and development of the baby. This is a test done that bounces harmless sound waves off the baby so your caregiver can more accurately determine due dates.   Discuss the type of pain medication and anesthesia you will have during your labor and delivery.   Discuss the possibility and anesthesia if a Cesarean Section might be necessary.   Inform your caregiver if there is any mental or physical violence at home.  Sometimes, a specialized non-stress test, contraction stress test and biophysical profile are done to make sure the baby is not having a problem. Checking the amniotic fluid surrounding the baby is called an amniocentesis. The amniotic fluid is removed by sticking a needle into the belly (abdomen). This is sometimes done near the end of pregnancy if an early delivery is required. In this case, it is done to help make sure the baby's lungs are mature enough for the baby to live outside of the womb. If the lungs are not mature and it is unsafe to deliver the baby, an  injection of cortisone medication is given to the mother 1 to 2 days before the delivery. This helps the baby's lungs mature and makes it safer to deliver the baby. CHANGES OCCURING IN THE THIRD TRIMESTER OF PREGNANCY Your body goes through many changes during pregnancy. They vary from person to person. Talk to your caregiver about changes you notice and are concerned about.  During the last trimester, you have probably had an increase in your appetite. It is normal to have cravings for certain foods. This varies from person to person and pregnancy to pregnancy.   You may begin to get stretch marks on your hips, abdomen, and breasts. These are normal changes in the body during pregnancy. There are no exercises or medications to take which prevent this change.   Constipation may be treated with a stool softener or adding bulk to your diet. Drinking lots of fluids, fiber in vegetables, fruits, and whole grains are helpful.   Exercising is also helpful. If you have been very active up until your pregnancy, most of these activities can   be continued during your pregnancy. If you have been less active, it is helpful to start an exercise program such as walking. Consult your caregiver before starting exercise programs.   Avoid all smoking, alcohol, un-prescribed drugs, herbs and "street drugs" during your pregnancy. These chemicals affect the formation and growth of the baby. Avoid chemicals throughout the pregnancy to ensure the delivery of a healthy infant.   Backache, varicose veins and hemorrhoids may develop or get worse.   You will tire more easily in the third trimester, which is normal.   The baby's movements may be stronger and more often.   You may become short of breath easily.   Your belly button may stick out.   A yellow discharge may leak from your breasts called colostrum.   You may have a bloody mucus discharge. This usually occurs a few days to a week before labor begins.  HOME  CARE INSTRUCTIONS   Keep your caregiver's appointments. Follow your caregiver's instructions regarding medication use, exercise, and diet.   During pregnancy, you are providing food for you and your baby. Continue to eat regular, well-balanced meals. Choose foods such as meat, fish, milk and other low fat dairy products, vegetables, fruits, and whole-grain breads and cereals. Your caregiver will tell you of the ideal weight gain.   A physical sexual relationship may be continued throughout pregnancy if there are no other problems such as early (premature) leaking of amniotic fluid from the membranes, vaginal bleeding, or belly (abdominal) pain.   Exercise regularly if there are no restrictions. Check with your caregiver if you are unsure of the safety of your exercises. Greater weight gain will occur in the last 2 trimesters of pregnancy. Exercising helps:   Control your weight.   Get you in shape for labor and delivery.   You lose weight after you deliver.   Rest a lot with legs elevated, or as needed for leg cramps or low back pain.   Wear a good support or jogging bra for breast tenderness during pregnancy. This may help if worn during sleep. Pads or tissues may be used in the bra if you are leaking colostrum.   Do not use hot tubs, steam rooms, or saunas.   Wear your seat belt when driving. This protects you and your baby if you are in an accident.   Avoid raw meat, cat litter boxes and soil used by cats. These carry germs that can cause birth defects in the baby.   It is easier to loose urine during pregnancy. Tightening up and strengthening the pelvic muscles will help with this problem. You can practice stopping your urination while you are going to the bathroom. These are the same muscles you need to strengthen. It is also the muscles you would use if you were trying to stop from passing gas. You can practice tightening these muscles up 10 times a set and repeating this about 3  times per day. Once you know what muscles to tighten up, do not perform these exercises during urination. It is more likely to cause an infection by backing up the urine.   Ask for help if you have financial, counseling or nutritional needs during pregnancy. Your caregiver will be able to offer counseling for these needs as well as refer you for other special needs.   Make a list of emergency phone numbers and have them available.   Plan on getting help from family or friends when you go home from the hospital.     Make a trial run to the hospital.   Take prenatal classes with the father to understand, practice and ask questions about the labor and delivery.   Prepare the baby's room/nursery.   Do not travel out of the city unless it is absolutely necessary and with the advice of your caregiver.   Wear only low or no heal shoes to have better balance and prevent falling.  MEDICATIONS AND DRUG USE IN PREGNANCY  Take prenatal vitamins as directed. The vitamin should contain 1 milligram of folic acid. Keep all vitamins out of reach of children. Only a couple vitamins or tablets containing iron may be fatal to a baby or young child when ingested.   Avoid use of all medications, including herbs, over-the-counter medications, not prescribed or suggested by your caregiver. Only take over-the-counter or prescription medicines for pain, discomfort, or fever as directed by your caregiver. Do not use aspirin, ibuprofen (Motrin, Advil, Nuprin) or naproxen (Aleve) unless OK'd by your caregiver.   Let your caregiver also know about herbs you may be using.   Alcohol is related to a number of birth defects. This includes fetal alcohol syndrome. All alcohol, in any form, should be avoided completely. Smoking will cause low birth rate and premature babies.   Street/illegal drugs are very harmful to the baby. They are absolutely forbidden. A baby born to an addicted mother will be addicted at birth. The  baby will go through the same withdrawal an adult does.  SEEK MEDICAL CARE IF: You have any concerns or worries during your pregnancy. It is better to call with your questions if you feel they cannot wait, rather than worry about them. DECISIONS ABOUT CIRCUMCISION You may or may not know the sex of your baby. If you know your baby is a boy, it may be time to think about circumcision. Circumcision is the removal of the foreskin of the penis. This is the skin that covers the sensitive end of the penis. There is no proven medical need for this. Often this decision is made on what is popular at the time or based upon religious beliefs and social issues. You can discuss these issues with your caregiver or pediatrician. SEEK IMMEDIATE MEDICAL CARE IF:   An unexplained oral temperature above 102 F (38.9 C) develops, or as your caregiver suggests.   You have leaking of fluid from the vagina (birth canal). If leaking membranes are suspected, take your temperature and tell your caregiver of this when you call.   There is vaginal spotting, bleeding or passing clots. Tell your caregiver of the amount and how many pads are used.   You develop a bad smelling vaginal discharge with a change in the color from clear to white.   You develop vomiting that lasts more than 24 hours.   You develop chills or fever.   You develop shortness of breath.   You develop burning on urination.   You loose more than 2 pounds of weight or gain more than 2 pounds of weight or as suggested by your caregiver.   You notice sudden swelling of your face, hands, and feet or legs.   You develop belly (abdominal) pain. Round ligament discomfort is a common non-cancerous (benign) cause of abdominal pain in pregnancy. Your caregiver still must evaluate you.   You develop a severe headache that does not go away.   You develop visual problems, blurred or double vision.   If you have not felt your baby move for more than   1 hour.  If you think the baby is not moving as much as usual, eat something with sugar in it and lie down on your left side for an hour. The baby should move at least 4 to 5 times per hour. Call right away if your baby moves less than that.   You fall, are in a car accident or any kind of trauma.   There is mental or physical violence at home.  Document Released: 02/06/2001 Document Revised: 02/01/2011 Document Reviewed: 08/11/2008 [employer] Patient Information 2012 [employer]. Breastfeeding BENEFITS OF BREASTFEEDING For the baby  The first milk (colostrum) helps the baby's digestive system function better.   There are antibodies from the mother in the milk that help the baby fight off infections.   The baby has a lower incidence of asthma, allergies, and SIDS (sudden infant death syndrome).   The nutrients in breast milk are better than formulas for the baby and helps the baby's brain grow better.   Babies who breastfeed have less gas, colic, and constipation.  For the mother  Breastfeeding helps develop a very special bond between mother and baby.   It is more convenient, always available at the correct temperature and cheaper than formula feeding.   It burns calories in the mother and helps with losing weight that was gained during pregnancy.   It makes the uterus contract back down to normal size faster and slows bleeding following delivery.   Breastfeeding mothers have a lower risk of developing breast cancer.  NURSE FREQUENTLY  A healthy, full-term baby may breastfeed as often as every hour or space his or her feedings to every 3 hours.   How often to nurse will vary from baby to baby. Watch your baby for signs of hunger, not the clock.   Nurse as often as the baby requests, or when you feel the need to reduce the fullness of your breasts.   Awaken the baby if it has been 3 to 4 hours since the last feeding.   Frequent feeding will help the mother make more milk and will  prevent problems like sore nipples and engorgement of the breasts.  BABY'S POSITION AT THE BREAST  Whether lying down or sitting, be sure that the baby's tummy is facing your tummy.   Support the breast with 4 fingers underneath the breast and the thumb above. Make sure your fingers are well away from the nipple and baby's mouth.   Stroke the baby's lips and cheek closest to the breast gently with your finger or nipple.   When the baby's mouth is open wide enough, place all of your nipple and as much of the dark area around the nipple as possible into your baby's mouth.   Pull the baby in close so the tip of the nose and the baby's cheeks touch the breast during the feeding.  FEEDINGS  The length of each feeding varies from baby to baby and from feeding to feeding.   The baby must suck about 2 to 3 minutes for your milk to get to him or her. This is called a "let down." For this reason, allow the baby to feed on each breast as long as he or she wants. Your baby will end the feeding when he or she has received the right balance of nutrients.   To break the suction, put your finger into the corner of the baby's mouth and slide it between his or her gums before removing your breast from   his or her mouth. This will help prevent sore nipples.  REDUCING BREAST ENGORGEMENT  In the first week after your baby is born, you may experience signs of breast engorgement. When breasts are engorged, they feel heavy, warm, full, and may be tender to the touch. You can reduce engorgement if you:   Nurse frequently, every 2 to 3 hours. Mothers who breastfeed early and often have fewer problems with engorgement.   Place light ice packs on your breasts between feedings. This reduces swelling. Wrap the ice packs in a lightweight towel to protect your skin.   Apply moist hot packs to your breast for 5 to 10 minutes before each feeding. This increases circulation and helps the milk flow.   Gently massage your  breast before and during the feeding.   Make sure that the baby empties at least one breast at every feeding before switching sides.   Use a breast pump to empty the breasts if your baby is sleepy or not nursing well. You may also want to pump if you are returning to work or or you feel you are getting engorged.   Avoid bottle feeds, pacifiers or supplemental feedings of water or juice in place of breastfeeding.   Be sure the baby is latched on and positioned properly while breastfeeding.   Prevent fatigue, stress, and anemia.   Wear a supportive bra, avoiding underwire styles.   Eat a balanced diet with enough fluids.  If you follow these suggestions, your engorgement should improve in 24 to 48 hours. If you are still experiencing difficulty, call your lactation consultant or caregiver. IS MY BABY GETTING ENOUGH MILK? Sometimes, mothers worry about whether their babies are getting enough milk. You can be assured that your baby is getting enough milk if:  The baby is actively sucking and you hear swallowing.   The baby nurses at least 8 to 12 times in a 24 hour time period. Nurse your baby until he or she unlatches or falls asleep at the first breast (at least 10 to 20 minutes), then offer the second side.   The baby is wetting 5 to 6 disposable diapers (6 to 8 cloth diapers) in a 24 hour period by 5 to 6 days of age.   The baby is having at least 2 to 3 stools every 24 hours for the first few months. Breast milk is all the food your baby needs. It is not necessary for your baby to have water or formula. In fact, to help your breasts make more milk, it is best not to give your baby supplemental feedings during the early weeks.   The stool should be soft and yellow.   The baby should gain 4 to 7 ounces per week after he is 4 days old.  TAKE CARE OF YOURSELF Take care of your breasts by:  Bathing or showering daily.   Avoiding the use of soaps on your nipples.   Start feedings on  your left breast at one feeding and on your right breast at the next feeding.   You will notice an increase in your milk supply 2 to 5 days after delivery. You may feel some discomfort from engorgement, which makes your breasts very firm and often tender. Engorgement "peaks" out within 24 to 48 hours. In the meantime, apply warm moist towels to your breasts for 5 to 10 minutes before feeding. Gentle massage and expression of some milk before feeding will soften your breasts, making it easier for your   baby to latch on. Wear a well fitting nursing bra and air dry your nipples for 10 to 15 minutes after each feeding.   Only use cotton bra pads.   Only use pure lanolin on your nipples after nursing. You do not need to wash it off before nursing.  Take care of yourself by:   Eating well-balanced meals and nutritious snacks.   Drinking milk, fruit juice, and water to satisfy your thirst (about 8 glasses a day).   Getting plenty of rest.   Increasing calcium in your diet (1200 mg a day).   Avoiding foods that you notice affect the baby in a bad way.  SEEK MEDICAL CARE IF:   You have any questions or difficulty with breastfeeding.   You need help.   You have a hard, red, sore area on your breast, accompanied by a fever of 100.5 F (38.1 C) or more.   Your baby is too sleepy to eat well or is having trouble sleeping.   Your baby is wetting less than 6 diapers per day, by 5 days of age.   Your baby's skin or white part of his or her eyes is more yellow than it was in the hospital.   You feel depressed.  Document Released: 02/12/2005 Document Revised: 02/01/2011 Document Reviewed: 09/27/2008 [employer] Patient Information 2012 [employer]. 

## 2011-05-15 NOTE — Progress Notes (Signed)
Doing well--PTL precautions  

## 2011-05-29 ENCOUNTER — Ambulatory Visit (INDEPENDENT_AMBULATORY_CARE_PROVIDER_SITE_OTHER): Payer: PRIVATE HEALTH INSURANCE | Admitting: Obstetrics & Gynecology

## 2011-05-29 VITALS — BP 104/68 | Wt 160.1 lb

## 2011-05-29 DIAGNOSIS — Z3400 Encounter for supervision of normal first pregnancy, unspecified trimester: Secondary | ICD-10-CM

## 2011-05-29 NOTE — Progress Notes (Signed)
No problems except Tums for heartburn.

## 2011-05-29 NOTE — Patient Instructions (Signed)
Breastfeeding BENEFITS OF BREASTFEEDING For the baby  The first milk (colostrum) helps the baby's digestive system function better.   There are antibodies from the mother in the milk that help the baby fight off infections.   The baby has a lower incidence of asthma, allergies, and SIDS (sudden infant death syndrome).   The nutrients in breast milk are better than formulas for the baby and helps the baby's brain grow better.   Babies who breastfeed have less gas, colic, and constipation.  For the mother  Breastfeeding helps develop a very special bond between mother and baby.   It is more convenient, always available at the correct temperature and cheaper than formula feeding.   It burns calories in the mother and helps with losing weight that was gained during pregnancy.   It makes the uterus contract back down to normal size faster and slows bleeding following delivery.   Breastfeeding mothers have a lower risk of developing breast cancer.  NURSE FREQUENTLY  A healthy, full-term baby may breastfeed as often as every hour or space his or her feedings to every 3 hours.   How often to nurse will vary from baby to baby. Watch your baby for signs of hunger, not the clock.   Nurse as often as the baby requests, or when you feel the need to reduce the fullness of your breasts.   Awaken the baby if it has been 3 to 4 hours since the last feeding.   Frequent feeding will help the mother make more milk and will prevent problems like sore nipples and engorgement of the breasts.  BABY'S POSITION AT THE BREAST  Whether lying down or sitting, be sure that the baby's tummy is facing your tummy.   Support the breast with 4 fingers underneath the breast and the thumb above. Make sure your fingers are well away from the nipple and baby's mouth.   Stroke the baby's lips and cheek closest to the breast gently with your finger or nipple.   When the baby's mouth is open wide enough, place all  of your nipple and as much of the dark area around the nipple as possible into your baby's mouth.   Pull the baby in close so the tip of the nose and the baby's cheeks touch the breast during the feeding.  FEEDINGS  The length of each feeding varies from baby to baby and from feeding to feeding.   The baby must suck about 2 to 3 minutes for your milk to get to him or her. This is called a "let down." For this reason, allow the baby to feed on each breast as long as he or she wants. Your baby will end the feeding when he or she has received the right balance of nutrients.   To break the suction, put your finger into the corner of the baby's mouth and slide it between his or her gums before removing your breast from his or her mouth. This will help prevent sore nipples.  REDUCING BREAST ENGORGEMENT  In the first week after your baby is born, you may experience signs of breast engorgement. When breasts are engorged, they feel heavy, warm, full, and may be tender to the touch. You can reduce engorgement if you:   Nurse frequently, every 2 to 3 hours. Mothers who breastfeed early and often have fewer problems with engorgement.   Place light ice packs on your breasts between feedings. This reduces swelling. Wrap the ice packs in a   lightweight towel to protect your skin.   Apply moist hot packs to your breast for 5 to 10 minutes before each feeding. This increases circulation and helps the milk flow.   Gently massage your breast before and during the feeding.   Make sure that the baby empties at least one breast at every feeding before switching sides.   Use a breast pump to empty the breasts if your baby is sleepy or not nursing well. You may also want to pump if you are returning to work or or you feel you are getting engorged.   Avoid bottle feeds, pacifiers or supplemental feedings of water or juice in place of breastfeeding.   Be sure the baby is latched on and positioned properly while  breastfeeding.   Prevent fatigue, stress, and anemia.   Wear a supportive bra, avoiding underwire styles.   Eat a balanced diet with enough fluids.  If you follow these suggestions, your engorgement should improve in 24 to 48 hours. If you are still experiencing difficulty, call your lactation consultant or caregiver. IS MY BABY GETTING ENOUGH MILK? Sometimes, mothers worry about whether their babies are getting enough milk. You can be assured that your baby is getting enough milk if:  The baby is actively sucking and you hear swallowing.   The baby nurses at least 8 to 12 times in a 24 hour time period. Nurse your baby until he or she unlatches or falls asleep at the first breast (at least 10 to 20 minutes), then offer the second side.   The baby is wetting 5 to 6 disposable diapers (6 to 8 cloth diapers) in a 24 hour period by 5 to 6 days of age.   The baby is having at least 2 to 3 stools every 24 hours for the first few months. Breast milk is all the food your baby needs. It is not necessary for your baby to have water or formula. In fact, to help your breasts make more milk, it is best not to give your baby supplemental feedings during the early weeks.   The stool should be soft and yellow.   The baby should gain 4 to 7 ounces per week after he is 4 days old.  TAKE CARE OF YOURSELF Take care of your breasts by:  Bathing or showering daily.   Avoiding the use of soaps on your nipples.   Start feedings on your left breast at one feeding and on your right breast at the next feeding.   You will notice an increase in your milk supply 2 to 5 days after delivery. You may feel some discomfort from engorgement, which makes your breasts very firm and often tender. Engorgement "peaks" out within 24 to 48 hours. In the meantime, apply warm moist towels to your breasts for 5 to 10 minutes before feeding. Gentle massage and expression of some milk before feeding will soften your breasts, making  it easier for your baby to latch on. Wear a well fitting nursing bra and air dry your nipples for 10 to 15 minutes after each feeding.   Only use cotton bra pads.   Only use pure lanolin on your nipples after nursing. You do not need to wash it off before nursing.  Take care of yourself by:   Eating well-balanced meals and nutritious snacks.   Drinking milk, fruit juice, and water to satisfy your thirst (about 8 glasses a day).   Getting plenty of rest.   Increasing calcium in   your diet (1200 mg a day).   Avoiding foods that you notice affect the baby in a bad way.  SEEK MEDICAL CARE IF:   You have any questions or difficulty with breastfeeding.   You need help.   You have a hard, red, sore area on your breast, accompanied by a fever of 100.5 F (38.1 C) or more.   Your baby is too sleepy to eat well or is having trouble sleeping.   Your baby is wetting less than 6 diapers per day, by 5 days of age.   Your baby's skin or white part of his or her eyes is more yellow than it was in the hospital.   You feel depressed.  Document Released: 02/12/2005 Document Revised: 02/01/2011 Document Reviewed: 09/27/2008 [employer] Patient Information 2012 [employer]. 

## 2011-06-12 ENCOUNTER — Ambulatory Visit (INDEPENDENT_AMBULATORY_CARE_PROVIDER_SITE_OTHER): Payer: PRIVATE HEALTH INSURANCE | Admitting: Obstetrics & Gynecology

## 2011-06-12 VITALS — BP 102/70 | Wt 162.0 lb

## 2011-06-12 DIAGNOSIS — F419 Anxiety disorder, unspecified: Secondary | ICD-10-CM

## 2011-06-12 DIAGNOSIS — F411 Generalized anxiety disorder: Secondary | ICD-10-CM

## 2011-06-12 DIAGNOSIS — O09529 Supervision of elderly multigravida, unspecified trimester: Secondary | ICD-10-CM

## 2011-06-12 DIAGNOSIS — IMO0002 Reserved for concepts with insufficient information to code with codable children: Secondary | ICD-10-CM

## 2011-06-12 DIAGNOSIS — Z3400 Encounter for supervision of normal first pregnancy, unspecified trimester: Secondary | ICD-10-CM

## 2011-06-12 DIAGNOSIS — O26899 Other specified pregnancy related conditions, unspecified trimester: Secondary | ICD-10-CM

## 2011-06-12 DIAGNOSIS — R12 Heartburn: Secondary | ICD-10-CM

## 2011-06-12 DIAGNOSIS — G43109 Migraine with aura, not intractable, without status migrainosus: Secondary | ICD-10-CM

## 2011-06-12 MED ORDER — FAMOTIDINE 20 MG PO TABS: 40.0000 mg | ORAL_TABLET | Freq: Two times a day (BID) | ORAL | Status: DC | PRN

## 2011-06-12 NOTE — Patient Instructions (Addendum)
Return to clinic for any obstetric concerns or go to [facility] for evaluation Heartburn During Pregnancy  Heartburn is a burning sensation in the chest caused by stomach acid backing up into the esophagus. Heartburn (also known as "reflux") is common in pregnancy because a certain hormone (progesterone) changes. The progesterone hormone may relax the valve that separates the esophagus from the stomach. This allows acid to go up into the esophagus, causing heartburn. Heartburn may also happen in pregnancy because the enlarging uterus pushes up on the stomach, which pushes more acid into the esophagus. This is especially true in the later stages of pregnancy. Heartburn problems usually go away after giving birth. CAUSES   The progesterone hormone.   Changing hormone levels.   The growing uterus that pushes stomach acid upward.   Large meals.   Certain foods and drinks.   Exercise.   Increased acid production.  SYMPTOMS   Burning pain in the chest or lower throat.   Bitter taste in the mouth.   Coughing.  DIAGNOSIS  Heartburn is typically diagnosed by your caregiver when taking a careful history of your concern. Your caregiver may order a blood test to check for a certain type of bacteria that is associated with heartburn. Sometimes, heartburn is diagnosed by prescribing a heartburn medicine to see if the symptoms improve. It is rare in pregnancy to have a procedure called an endoscopy. This is when a tube with a light and a camera on the end is used to examine the esophagus and the stomach. TREATMENT   Your caregiver may tell you to use certain over-the-counter medicines (antacids, acid reducers) for mild heartburn.   Your caregiver may prescribe medicines to decrease stomach acid or to protect your stomach lining.   Your caregiver may recommend certain diet changes.   For severe cases, your caregiver may recommend that the head of the bed be elevated on blocks. (Sleeping with more pillows  is not an effective treatment as it only changes the position of your head and does not improve the main problem of stomach acid refluxing into the esophagus.)  HOME CARE INSTRUCTIONS   Take all medicines as directed by your caregiver.   Raise the head of your bed by putting blocks under the legs if instructed to by your caregiver.   Do not exercise right after eating.   Avoid eating 2 or 3 hours before bed. Do not lie down right after eating.   Eat small meals throughout the day instead of 3 large meals.   Identify foods and beverages that make your symptoms worse and avoid them. Foods you may want to avoid include:   Peppers.   Chocolate.   High-fat foods, including fried foods.   Spicy foods.   Garlic and onions.   Citrus fruits, including oranges, grapefruit, lemons, and limes.   Food containing tomatoes or tomato products.   Mint.   Carbonated and caffeinated drinks.   Vinegar.  SEEK IMMEDIATE MEDICAL CARE IF:   You have severe chest pain that goes down your arm or into your jaw or neck.   You feel sweaty, dizzy, or lightheaded.   You become short of breath.   You vomit blood.   You have difficulty or pain with swallowing.   You have bloody or black, tarry stools.   You have episodes of heartburn more than 3 times a week, for more than 2 weeks.  MAKE SURE YOU:  Understand these instructions.   Will watch your condition.  Will get help right away if you are not doing well or get worse.  Document Released: 02/10/2000 Document Revised: 02/01/2011 Document Reviewed: 08/03/2010 [facility] Patient Information 2012 [place], [place].

## 2011-06-12 NOTE — Progress Notes (Signed)
Complains of increased heartburn, Pepcid prescribed. No other complaints or concerns.  Fetal movement and labor precautions reviewed.  Pelvic cultures next visit.

## 2011-06-14 ENCOUNTER — Telehealth: Payer: Self-pay | Admitting: *Deleted

## 2011-06-14 MED ORDER — RANITIDINE HCL 150 MG PO TABS: 150.0000 mg | ORAL_TABLET | Freq: Two times a day (BID) | ORAL | Status: DC

## 2011-06-14 NOTE — Telephone Encounter (Signed)
Patients insurance does not cover the famotidine, so I have changed it to ranitidine and will fax over a new prescription.

## 2011-06-20 ENCOUNTER — Ambulatory Visit: Payer: PRIVATE HEALTH INSURANCE | Admitting: Obstetrics and Gynecology

## 2011-06-20 VITALS — BP 109/71 | Wt 161.0 lb

## 2011-06-20 DIAGNOSIS — Z3400 Encounter for supervision of normal first pregnancy, unspecified trimester: Secondary | ICD-10-CM

## 2011-06-20 DIAGNOSIS — IMO0002 Reserved for concepts with insufficient information to code with codable children: Secondary | ICD-10-CM

## 2011-06-20 DIAGNOSIS — O26899 Other specified pregnancy related conditions, unspecified trimester: Secondary | ICD-10-CM

## 2011-06-20 LAB — OB RESULTS CONSOLE GBS: GBS: NEGATIVE

## 2011-06-20 NOTE — Progress Notes (Signed)
Patient doing well without complaints. Hearth burn significantly improved since last visit. FM/labor precautions reviewed. Cervix: closed/thick/long/post/firm

## 2011-06-21 LAB — GC/CHLAMYDIA PROBE AMP, GENITAL: GC Probe Amp, Genital: NEGATIVE

## 2011-06-23 LAB — CULTURE, BETA STREP (GROUP B ONLY)

## 2011-06-27 ENCOUNTER — Ambulatory Visit (INDEPENDENT_AMBULATORY_CARE_PROVIDER_SITE_OTHER): Payer: PRIVATE HEALTH INSURANCE | Admitting: Family Medicine

## 2011-06-27 DIAGNOSIS — Z3480 Encounter for supervision of other normal pregnancy, unspecified trimester: Secondary | ICD-10-CM

## 2011-06-27 NOTE — Progress Notes (Signed)
Doing well, no problems.  Labor precautions given.

## 2011-06-27 NOTE — Patient Instructions (Addendum)
Normal Labor and Delivery Your caregiver must first be sure you are in labor. Signs of labor include:  You may pass what is called "the mucus plug" before labor begins. This is a small amount of blood stained mucus.   Regular uterine contractions.   The time between contractions get closer together.   The discomfort and pain gradually gets more intense.   Pains are mostly located in the back.   Pains get worse when walking.   The cervix (the opening of the uterus becomes thinner (begins to efface) and opens up (dilates).  Once you are in labor and admitted into the hospital or care center, your caregiver will do the following:  A complete physical examination.   Check your vital signs (blood pressure, pulse, temperature and the fetal heart rate).   Do a vaginal examination (using a sterile glove and lubricant) to determine:   The position (presentation) of the baby (head [vertex] or buttock first).   The level (station) of the baby's head in the birth canal.   The effacement and dilatation of the cervix.   You may have your pubic hair shaved and be given an enema depending on your caregiver and the circumstance.   An electronic monitor is usually placed on your abdomen. The monitor follows the length and intensity of the contractions, as well as the baby's heart rate.   Usually, your caregiver will insert an IV in your arm with a bottle of sugar water. This is done as a precaution so that medications can be given to you quickly during labor or delivery.  NORMAL LABOR AND DELIVERY IS DIVIDED UP INTO 3 STAGES: First Stage This is when regular contractions begin and the cervix begins to efface and dilate. This stage can last from 3 to 15 hours. The end of the first stage is when the cervix is 100% effaced and 10 centimeters dilated. Pain medications may be given by   Injection (morphine, demerol, etc.)   Regional anesthesia (spinal, caudal or epidural, anesthetics given in  different locations of the spine). Paracervical pain medication may be given, which is an injection of and anesthetic on each side of the cervix.  A pregnant woman may request to have "Natural Childbirth" which is not to have any medications or anesthesia during her labor and delivery. Second Stage This is when the baby comes down through the birth canal (vagina) and is born. This can take 1 to 4 hours. As the baby's head comes down through the birth canal, you may feel like you are going to have a bowel movement. You will get the urge to bear down and push until the baby is delivered. As the baby's head is being delivered, the caregiver will decide if an episiotomy (a cut in the perineum and vagina area) is needed to prevent tearing of the tissue in this area. The episiotomy is sewn up after the delivery of the baby and placenta. Sometimes a mask with nitrous oxide is given for the mother to breath during the delivery of the baby to help if there is too much pain. The end of Stage 2 is when the baby is fully delivered. Then when the umbilical cord stops pulsating it is clamped and cut. Third Stage The third stage begins after the baby is completely delivered and ends after the placenta (afterbirth) is delivered. This usually takes 5 to 30 minutes. After the placenta is delivered, a medication is given either by intravenous or injection to help contract   the uterus and prevent bleeding. The third stage is not painful and pain medication is usually not necessary. If an episiotomy was done, it is repaired at this time. After the delivery, the mother is watched and monitored closely for 1 to 2 hours to make sure there is no postpartum bleeding (hemorrhage). If there is a lot of bleeding, medication is given to contract the uterus and stop the bleeding. Document Released: 11/22/2007 Document Revised: 02/01/2011 Document Reviewed: 11/22/2007 [employer] Patient Information 2012 [employer]. Breastfeeding BENEFITS OF BREASTFEEDING For the baby  The first milk (colostrum) helps the baby's digestive system function better.   There are antibodies from the mother in the milk that help the baby fight off infections.   The baby has a lower incidence of asthma, allergies, and SIDS (sudden infant death syndrome).   The nutrients in breast milk are better than formulas for the baby and helps the baby's brain grow better.   Babies who breastfeed have less gas, colic, and constipation.  For the mother  Breastfeeding helps develop a very special bond between mother and baby.   It is more convenient, always available at the correct temperature and cheaper than formula feeding.   It burns calories in the mother and helps with losing weight that was gained during pregnancy.   It makes the uterus contract back down to normal size faster and slows bleeding following delivery.   Breastfeeding mothers have a lower risk of developing breast cancer.  NURSE FREQUENTLY  A healthy, full-term baby may breastfeed as often as every hour or space his or her feedings to every 3 hours.   How often to nurse will vary from baby to baby. Watch your baby for signs of hunger, not the clock.   Nurse as often as the baby requests, or when you feel the need to reduce the fullness of your breasts.   Awaken the baby if it has been 3 to 4 hours since the last feeding.   Frequent feeding will help the mother make more milk and will prevent problems like sore nipples and engorgement of the breasts.  BABY'S POSITION AT THE BREAST  Whether lying down or sitting, be sure that the baby's tummy is facing your tummy.   Support the breast with 4 fingers underneath the breast and the thumb above. Make sure your fingers are well away from the nipple and baby's mouth.   Stroke the baby's lips and cheek closest to the breast gently with your finger or nipple.   When the baby's mouth is open wide enough,  place all of your nipple and as much of the dark area around the nipple as possible into your baby's mouth.   Pull the baby in close so the tip of the nose and the baby's cheeks touch the breast during the feeding.  FEEDINGS  The length of each feeding varies from baby to baby and from feeding to feeding.   The baby must suck about 2 to 3 minutes for your milk to get to him or her. This is called a "let down." For this reason, allow the baby to feed on each breast as long as he or she wants. Your baby will end the feeding when he or she has received the right balance of nutrients.   To break the suction, put your finger into the corner of the baby's mouth and slide it between his or her gums before removing your breast from his or her mouth. This will   help prevent sore nipples.  REDUCING BREAST ENGORGEMENT  In the first week after your baby is born, you may experience signs of breast engorgement. When breasts are engorged, they feel heavy, warm, full, and may be tender to the touch. You can reduce engorgement if you:   Nurse frequently, every 2 to 3 hours. Mothers who breastfeed early and often have fewer problems with engorgement.   Place light ice packs on your breasts between feedings. This reduces swelling. Wrap the ice packs in a lightweight towel to protect your skin.   Apply moist hot packs to your breast for 5 to 10 minutes before each feeding. This increases circulation and helps the milk flow.   Gently massage your breast before and during the feeding.   Make sure that the baby empties at least one breast at every feeding before switching sides.   Use a breast pump to empty the breasts if your baby is sleepy or not nursing well. You may also want to pump if you are returning to work or or you feel you are getting engorged.   Avoid bottle feeds, pacifiers or supplemental feedings of water or juice in place of breastfeeding.   Be sure the baby is latched on and positioned properly  while breastfeeding.   Prevent fatigue, stress, and anemia.   Wear a supportive bra, avoiding underwire styles.   Eat a balanced diet with enough fluids.  If you follow these suggestions, your engorgement should improve in 24 to 48 hours. If you are still experiencing difficulty, call your lactation consultant or caregiver. IS MY BABY GETTING ENOUGH MILK? Sometimes, mothers worry about whether their babies are getting enough milk. You can be assured that your baby is getting enough milk if:  The baby is actively sucking and you hear swallowing.   The baby nurses at least 8 to 12 times in a 24 hour time period. Nurse your baby until he or she unlatches or falls asleep at the first breast (at least 10 to 20 minutes), then offer the second side.   The baby is wetting 5 to 6 disposable diapers (6 to 8 cloth diapers) in a 24 hour period by 5 to 6 days of age.   The baby is having at least 2 to 3 stools every 24 hours for the first few months. Breast milk is all the food your baby needs. It is not necessary for your baby to have water or formula. In fact, to help your breasts make more milk, it is best not to give your baby supplemental feedings during the early weeks.   The stool should be soft and yellow.   The baby should gain 4 to 7 ounces per week after he is 4 days old.  TAKE CARE OF YOURSELF Take care of your breasts by:  Bathing or showering daily.   Avoiding the use of soaps on your nipples.   Start feedings on your left breast at one feeding and on your right breast at the next feeding.   You will notice an increase in your milk supply 2 to 5 days after delivery. You may feel some discomfort from engorgement, which makes your breasts very firm and often tender. Engorgement "peaks" out within 24 to 48 hours. In the meantime, apply warm moist towels to your breasts for 5 to 10 minutes before feeding. Gentle massage and expression of some milk before feeding will soften your breasts,  making it easier for your baby to latch on. Wear a   well fitting nursing bra and air dry your nipples for 10 to 15 minutes after each feeding.   Only use cotton bra pads.   Only use pure lanolin on your nipples after nursing. You do not need to wash it off before nursing.  Take care of yourself by:   Eating well-balanced meals and nutritious snacks.   Drinking milk, fruit juice, and water to satisfy your thirst (about 8 glasses a day).   Getting plenty of rest.   Increasing calcium in your diet (1200 mg a day).   Avoiding foods that you notice affect the baby in a bad way.  SEEK MEDICAL CARE IF:   You have any questions or difficulty with breastfeeding.   You need help.   You have a hard, red, sore area on your breast, accompanied by a fever of 100.5 F (38.1 C) or more.   Your baby is too sleepy to eat well or is having trouble sleeping.   Your baby is wetting less than 6 diapers per day, by 5 days of age.   Your baby's skin or white part of his or her eyes is more yellow than it was in the hospital.   You feel depressed.  Document Released: 02/12/2005 Document Revised: 02/01/2011 Document Reviewed: 09/27/2008 [employer] Patient Information 2012 [employer]. 

## 2011-06-27 NOTE — Progress Notes (Signed)
Routine visit.  Doing well, having some irregular cramping.

## 2011-07-04 ENCOUNTER — Ambulatory Visit (INDEPENDENT_AMBULATORY_CARE_PROVIDER_SITE_OTHER): Payer: PRIVATE HEALTH INSURANCE | Admitting: Obstetrics & Gynecology

## 2011-07-04 VITALS — BP 106/69 | Wt 164.0 lb

## 2011-07-04 DIAGNOSIS — Z3400 Encounter for supervision of normal first pregnancy, unspecified trimester: Secondary | ICD-10-CM

## 2011-07-04 NOTE — Progress Notes (Signed)
Routine visit. No problems. Good FM. Labor precautions reviewed. She declines a cervical exam. Denies VB, ROM, CTXs.

## 2011-07-10 ENCOUNTER — Ambulatory Visit (INDEPENDENT_AMBULATORY_CARE_PROVIDER_SITE_OTHER): Payer: PRIVATE HEALTH INSURANCE | Admitting: Family Medicine

## 2011-07-10 DIAGNOSIS — Z3480 Encounter for supervision of other normal pregnancy, unspecified trimester: Secondary | ICD-10-CM

## 2011-07-10 NOTE — Progress Notes (Signed)
Labor precautions reviewed 

## 2011-07-10 NOTE — Progress Notes (Signed)
Routine prenatal exam, no complaints.

## 2011-07-10 NOTE — Patient Instructions (Addendum)
Normal Labor and Delivery Your caregiver must first be sure you are in labor. Signs of labor include:  You may pass what is called "the mucus plug" before labor begins. This is a small amount of blood stained mucus.   Regular uterine contractions.   The time between contractions get closer together.   The discomfort and pain gradually gets more intense.   Pains are mostly located in the back.   Pains get worse when walking.   The cervix (the opening of the uterus becomes thinner (begins to efface) and opens up (dilates).  Once you are in labor and admitted into the hospital or care center, your caregiver will do the following:  A complete physical examination.   Check your vital signs (blood pressure, pulse, temperature and the fetal heart rate).   Do a vaginal examination (using a sterile glove and lubricant) to determine:   The position (presentation) of the baby (head [vertex] or buttock first).   The level (station) of the baby's head in the birth canal.   The effacement and dilatation of the cervix.   You may have your pubic hair shaved and be given an enema depending on your caregiver and the circumstance.   An electronic monitor is usually placed on your abdomen. The monitor follows the length and intensity of the contractions, as well as the baby's heart rate.   Usually, your caregiver will insert an IV in your arm with a bottle of sugar water. This is done as a precaution so that medications can be given to you quickly during labor or delivery.  NORMAL LABOR AND DELIVERY IS DIVIDED UP INTO 3 STAGES: First Stage This is when regular contractions begin and the cervix begins to efface and dilate. This stage can last from 3 to 15 hours. The end of the first stage is when the cervix is 100% effaced and 10 centimeters dilated. Pain medications may be given by   Injection (morphine, demerol, etc.)   Regional anesthesia (spinal, caudal or epidural, anesthetics given in  different locations of the spine). Paracervical pain medication may be given, which is an injection of and anesthetic on each side of the cervix.  A pregnant woman may request to have "Natural Childbirth" which is not to have any medications or anesthesia during her labor and delivery. Second Stage This is when the baby comes down through the birth canal (vagina) and is born. This can take 1 to 4 hours. As the baby's head comes down through the birth canal, you may feel like you are going to have a bowel movement. You will get the urge to bear down and push until the baby is delivered. As the baby's head is being delivered, the caregiver will decide if an episiotomy (a cut in the perineum and vagina area) is needed to prevent tearing of the tissue in this area. The episiotomy is sewn up after the delivery of the baby and placenta. Sometimes a mask with nitrous oxide is given for the mother to breath during the delivery of the baby to help if there is too much pain. The end of Stage 2 is when the baby is fully delivered. Then when the umbilical cord stops pulsating it is clamped and cut. Third Stage The third stage begins after the baby is completely delivered and ends after the placenta (afterbirth) is delivered. This usually takes 5 to 30 minutes. After the placenta is delivered, a medication is given either by intravenous or injection to help contract   the uterus and prevent bleeding. The third stage is not painful and pain medication is usually not necessary. If an episiotomy was done, it is repaired at this time. After the delivery, the mother is watched and monitored closely for 1 to 2 hours to make sure there is no postpartum bleeding (hemorrhage). If there is a lot of bleeding, medication is given to contract the uterus and stop the bleeding. Document Released: 11/22/2007 Document Revised: 02/01/2011 Document Reviewed: 11/22/2007 [employer] Patient Information 2012 [employer]. Breastfeeding BENEFITS OF BREASTFEEDING For the baby  The first milk (colostrum) helps the baby's digestive system function better.   There are antibodies from the mother in the milk that help the baby fight off infections.   The baby has a lower incidence of asthma, allergies, and SIDS (sudden infant death syndrome).   The nutrients in breast milk are better than formulas for the baby and helps the baby's brain grow better.   Babies who breastfeed have less gas, colic, and constipation.  For the mother  Breastfeeding helps develop a very special bond between mother and baby.   It is more convenient, always available at the correct temperature and cheaper than formula feeding.   It burns calories in the mother and helps with losing weight that was gained during pregnancy.   It makes the uterus contract back down to normal size faster and slows bleeding following delivery.   Breastfeeding mothers have a lower risk of developing breast cancer.  NURSE FREQUENTLY  A healthy, full-term baby may breastfeed as often as every hour or space his or her feedings to every 3 hours.   How often to nurse will vary from baby to baby. Watch your baby for signs of hunger, not the clock.   Nurse as often as the baby requests, or when you feel the need to reduce the fullness of your breasts.   Awaken the baby if it has been 3 to 4 hours since the last feeding.   Frequent feeding will help the mother make more milk and will prevent problems like sore nipples and engorgement of the breasts.  BABY'S POSITION AT THE BREAST  Whether lying down or sitting, be sure that the baby's tummy is facing your tummy.   Support the breast with 4 fingers underneath the breast and the thumb above. Make sure your fingers are well away from the nipple and baby's mouth.   Stroke the baby's lips and cheek closest to the breast gently with your finger or nipple.   When the baby's mouth is open wide enough,  place all of your nipple and as much of the dark area around the nipple as possible into your baby's mouth.   Pull the baby in close so the tip of the nose and the baby's cheeks touch the breast during the feeding.  FEEDINGS  The length of each feeding varies from baby to baby and from feeding to feeding.   The baby must suck about 2 to 3 minutes for your milk to get to him or her. This is called a "let down." For this reason, allow the baby to feed on each breast as long as he or she wants. Your baby will end the feeding when he or she has received the right balance of nutrients.   To break the suction, put your finger into the corner of the baby's mouth and slide it between his or her gums before removing your breast from his or her mouth. This will   help prevent sore nipples.  REDUCING BREAST ENGORGEMENT  In the first week after your baby is born, you may experience signs of breast engorgement. When breasts are engorged, they feel heavy, warm, full, and may be tender to the touch. You can reduce engorgement if you:   Nurse frequently, every 2 to 3 hours. Mothers who breastfeed early and often have fewer problems with engorgement.   Place light ice packs on your breasts between feedings. This reduces swelling. Wrap the ice packs in a lightweight towel to protect your skin.   Apply moist hot packs to your breast for 5 to 10 minutes before each feeding. This increases circulation and helps the milk flow.   Gently massage your breast before and during the feeding.   Make sure that the baby empties at least one breast at every feeding before switching sides.   Use a breast pump to empty the breasts if your baby is sleepy or not nursing well. You may also want to pump if you are returning to work or or you feel you are getting engorged.   Avoid bottle feeds, pacifiers or supplemental feedings of water or juice in place of breastfeeding.   Be sure the baby is latched on and positioned properly  while breastfeeding.   Prevent fatigue, stress, and anemia.   Wear a supportive bra, avoiding underwire styles.   Eat a balanced diet with enough fluids.  If you follow these suggestions, your engorgement should improve in 24 to 48 hours. If you are still experiencing difficulty, call your lactation consultant or caregiver. IS MY BABY GETTING ENOUGH MILK? Sometimes, mothers worry about whether their babies are getting enough milk. You can be assured that your baby is getting enough milk if:  The baby is actively sucking and you hear swallowing.   The baby nurses at least 8 to 12 times in a 24 hour time period. Nurse your baby until he or she unlatches or falls asleep at the first breast (at least 10 to 20 minutes), then offer the second side.   The baby is wetting 5 to 6 disposable diapers (6 to 8 cloth diapers) in a 24 hour period by 5 to 6 days of age.   The baby is having at least 2 to 3 stools every 24 hours for the first few months. Breast milk is all the food your baby needs. It is not necessary for your baby to have water or formula. In fact, to help your breasts make more milk, it is best not to give your baby supplemental feedings during the early weeks.   The stool should be soft and yellow.   The baby should gain 4 to 7 ounces per week after he is 4 days old.  TAKE CARE OF YOURSELF Take care of your breasts by:  Bathing or showering daily.   Avoiding the use of soaps on your nipples.   Start feedings on your left breast at one feeding and on your right breast at the next feeding.   You will notice an increase in your milk supply 2 to 5 days after delivery. You may feel some discomfort from engorgement, which makes your breasts very firm and often tender. Engorgement "peaks" out within 24 to 48 hours. In the meantime, apply warm moist towels to your breasts for 5 to 10 minutes before feeding. Gentle massage and expression of some milk before feeding will soften your breasts,  making it easier for your baby to latch on. Wear a   well fitting nursing bra and air dry your nipples for 10 to 15 minutes after each feeding.   Only use cotton bra pads.   Only use pure lanolin on your nipples after nursing. You do not need to wash it off before nursing.  Take care of yourself by:   Eating well-balanced meals and nutritious snacks.   Drinking milk, fruit juice, and water to satisfy your thirst (about 8 glasses a day).   Getting plenty of rest.   Increasing calcium in your diet (1200 mg a day).   Avoiding foods that you notice affect the baby in a bad way.  SEEK MEDICAL CARE IF:   You have any questions or difficulty with breastfeeding.   You need help.   You have a hard, red, sore area on your breast, accompanied by a fever of 100.5 F (38.1 C) or more.   Your baby is too sleepy to eat well or is having trouble sleeping.   Your baby is wetting less than 6 diapers per day, by 5 days of age.   Your baby's skin or white part of his or her eyes is more yellow than it was in the hospital.   You feel depressed.  Document Released: 02/12/2005 Document Revised: 02/01/2011 Document Reviewed: 09/27/2008 [employer] Patient Information 2012 [employer]. 

## 2011-07-17 ENCOUNTER — Encounter: Payer: Self-pay | Admitting: Obstetrics & Gynecology

## 2011-07-17 ENCOUNTER — Ambulatory Visit (HOSPITAL_COMMUNITY)
Admission: RE | Admit: 2011-07-17 | Discharge: 2011-07-17 | Disposition: A | Discharge status: Home / Self Care | Payer: Non-veteran care | Source: Ambulatory Visit | Attending: Obstetrics & Gynecology | Admitting: Obstetrics & Gynecology

## 2011-07-17 ENCOUNTER — Encounter (HOSPITAL_COMMUNITY): Payer: Self-pay | Admitting: *Deleted

## 2011-07-17 ENCOUNTER — Telehealth (HOSPITAL_COMMUNITY): Payer: Self-pay | Admitting: *Deleted

## 2011-07-17 ENCOUNTER — Other Ambulatory Visit: Payer: Self-pay | Admitting: Obstetrics & Gynecology

## 2011-07-17 ENCOUNTER — Ambulatory Visit (INDEPENDENT_AMBULATORY_CARE_PROVIDER_SITE_OTHER): Payer: PRIVATE HEALTH INSURANCE | Admitting: Obstetrics & Gynecology

## 2011-07-17 ENCOUNTER — Inpatient Hospital Stay (HOSPITAL_COMMUNITY)
Admission: AD | Admit: 2011-07-17 | Discharge: 2011-07-17 | Disposition: A | Discharge status: Home / Self Care | Payer: PRIVATE HEALTH INSURANCE | Source: Ambulatory Visit | Attending: Family Medicine | Admitting: Family Medicine

## 2011-07-17 VITALS — Wt 166.0 lb

## 2011-07-17 VITALS — BP 108/71 | HR 81 | Temp 97.8°F | Resp 18

## 2011-07-17 DIAGNOSIS — O368390 Maternal care for abnormalities of the fetal heart rate or rhythm, unspecified trimester, not applicable or unspecified: Secondary | ICD-10-CM | POA: Insufficient documentation

## 2011-07-17 DIAGNOSIS — Z3480 Encounter for supervision of other normal pregnancy, unspecified trimester: Secondary | ICD-10-CM

## 2011-07-17 DIAGNOSIS — IMO0002 Reserved for concepts with insufficient information to code with codable children: Secondary | ICD-10-CM

## 2011-07-17 DIAGNOSIS — O09529 Supervision of elderly multigravida, unspecified trimester: Secondary | ICD-10-CM

## 2011-07-17 DIAGNOSIS — Z3689 Encounter for other specified antenatal screening: Secondary | ICD-10-CM | POA: Insufficient documentation

## 2011-07-17 DIAGNOSIS — O288 Other abnormal findings on antenatal screening of mother: Secondary | ICD-10-CM

## 2011-07-17 DIAGNOSIS — Z3400 Encounter for supervision of normal first pregnancy, unspecified trimester: Secondary | ICD-10-CM

## 2011-07-17 DIAGNOSIS — O4190X0 Disorder of amniotic fluid and membranes, unspecified, unspecified trimester, not applicable or unspecified: Secondary | ICD-10-CM

## 2011-07-17 NOTE — Discharge Instructions (Signed)

## 2011-07-17 NOTE — Progress Notes (Signed)
Routine visit. No problems. Labor precautions given. Schedule IOL for 41 weeks. Good FM. NST reassuring but not reactive. She will get a BPP today and the results will be called to me before she leaves the BPP.

## 2011-07-17 NOTE — [facility] Note (Signed)
Non- reactive in office today.  Rollover from [place]

## 2011-07-17 NOTE — MAU Provider Note (Signed)
History     CSN: [id number]  Arrival date and time: 07/17/11 1610   None     No chief complaint on file.  HPI This is a 36 y.o. female at [redacted]w[redacted]d who presents from [place] with BPP score of 6/8.  She was seen in the clinic and had a nonreactive NST.  Denies painful contractions (does not feel the ones she has). Denies leaking or bleeding.   OB History    Grav Para Term Preterm Abortions TAB SAB Ect Mult Living   2 0 0 0 1 1 0 0 0 0       Past Medical History  Diagnosis Date  . Migraines     since 1996    Past Surgical History  Procedure Date  . Liposuction 01/2010    Family History  Problem Relation Age of Onset  . Heart disease Mother   . Diabetes Mother   . Hypertension Mother   . Alcohol abuse Father   . Cancer Maternal Uncle     lymphoma  . Hypertension Paternal Grandmother   . Gout Paternal Grandmother   . Asthma Paternal Grandmother     History  Substance Use Topics  . Smoking status: Never Smoker   . Smokeless tobacco: Never Used  . Alcohol Use: No    Allergies: No Known Allergies  Prescriptions prior to admission  Medication Sig Dispense Refill  . acetaminophen (TYLENOL) 500 MG tablet Take 1,000 mg by mouth daily as needed. For pain      . Prenatal Vit-Fe Fumarate-FA (PRENATAL MULTIVITAMIN) TABS Take 1 tablet by mouth daily.      . ranitidine (ZANTAC) 150 MG tablet Take 1 tablet (150 mg total) by mouth 2 (two) times daily.  60 tablet  11  . promethazine (PHENERGAN) 25 MG tablet Take 1 tablet (25 mg total) by mouth every 6 (six) hours as needed for nausea.  42 tablet  2  . promethazine (PHENERGAN) 25 MG tablet Take 1 tablet (25 mg total) by mouth every 6 (six) hours as needed for nausea.  30 tablet  3  . SUMAtriptan (IMITREX) 100 MG tablet Take 1 tablet (100 mg total) by mouth once as needed for migraine. Ok to repeat once in 2 hours  9 tablet  2    Review of Systems  Constitutional: Negative for fever.  Gastrointestinal: Negative for nausea and  vomiting.    Physical Exam   Blood pressure 108/71, pulse 76, temperature 97.8 F (36.6 C), temperature source Oral, resp. rate 18, last menstrual period 09/13/2010.  Physical Exam  Constitutional: She is oriented to person, place, and time. She appears well-developed and well-nourished. No distress.  HENT:  Head: Normocephalic.  Cardiovascular: Normal rate.   Respiratory: Effort normal.  GI: Soft. She exhibits no distension. There is no tenderness.  Genitourinary:       FHR Reactive for entire time Irregular mild contractions.  Musculoskeletal: Normal range of motion.  Neurological: She is alert and oriented to person, place, and time.  Skin: Skin is warm and dry.  Psychiatric: She has a normal mood and affect.   Cervix was 1-2 cm in office  MAU Course  Procedures  MDM Discussed with Dr Shawnie Pons. She reports that the fluid pocket did not meet their criteria but was 4cmwide.  Offered IOL after discussing results.  Patient wants to go home.  Discussed kick counts. Will recommend another NST Thursday or Friday.    Assessment and Plan  A:  SIUP at [redacted]w[redacted]d  Borderline AF level      Reactive FHR tracing P:  Discharge       Kick counts      Labor precautions      Office f/u end of week       NST later this week [facility] 07/17/2011, 4:31 PM

## 2011-07-17 NOTE — MAU Note (Signed)
Pt sent over from ultrasound for further evaluation

## 2011-07-17 NOTE — Telephone Encounter (Signed)
Preadmission screen  

## 2011-07-17 NOTE — Progress Notes (Signed)
Routine prenatal check/NST/  Patient feels like she lost her mucous plug.

## 2011-07-18 NOTE — MAU Provider Note (Signed)
Chart reviewed and agree with management and plan.  

## 2011-07-19 ENCOUNTER — Ambulatory Visit (INDEPENDENT_AMBULATORY_CARE_PROVIDER_SITE_OTHER): Payer: PRIVATE HEALTH INSURANCE | Admitting: Obstetrics & Gynecology

## 2011-07-19 VITALS — BP 125/76 | Wt 166.0 lb

## 2011-07-19 DIAGNOSIS — Z3400 Encounter for supervision of normal first pregnancy, unspecified trimester: Secondary | ICD-10-CM

## 2011-07-19 DIAGNOSIS — IMO0002 Reserved for concepts with insufficient information to code with codable children: Secondary | ICD-10-CM

## 2011-07-19 DIAGNOSIS — O480 Post-term pregnancy: Secondary | ICD-10-CM | POA: Insufficient documentation

## 2011-07-19 DIAGNOSIS — O09529 Supervision of elderly multigravida, unspecified trimester: Secondary | ICD-10-CM

## 2011-07-19 NOTE — Patient Instructions (Signed)
Return to clinic for any obstetric concerns or go to [facility] for evaluation  

## 2011-07-19 NOTE — Progress Notes (Signed)
Reactive NST today.  IOL scheduled for 07/23/11. No other complaints or concerns.  Fetal movement and labor precautions reviewed.

## 2011-07-20 ENCOUNTER — Inpatient Hospital Stay (HOSPITAL_COMMUNITY)
Admission: AD | Admit: 2011-07-20 | Discharge: 2011-07-23 | DRG: 774 | Disposition: A | Discharge status: Home / Self Care | Payer: Non-veteran care | Source: Ambulatory Visit | Attending: Obstetrics & Gynecology | Admitting: Obstetrics & Gynecology

## 2011-07-20 ENCOUNTER — Inpatient Hospital Stay (HOSPITAL_COMMUNITY): Payer: Non-veteran care

## 2011-07-20 ENCOUNTER — Encounter (HOSPITAL_COMMUNITY): Payer: Self-pay

## 2011-07-20 DIAGNOSIS — O480 Post-term pregnancy: Secondary | ICD-10-CM

## 2011-07-20 DIAGNOSIS — Z3400 Encounter for supervision of normal first pregnancy, unspecified trimester: Secondary | ICD-10-CM

## 2011-07-20 DIAGNOSIS — IMO0002 Reserved for concepts with insufficient information to code with codable children: Secondary | ICD-10-CM

## 2011-07-20 DIAGNOSIS — O09529 Supervision of elderly multigravida, unspecified trimester: Secondary | ICD-10-CM | POA: Diagnosis present

## 2011-07-20 DIAGNOSIS — O411090 Infection of amniotic sac and membranes, unspecified, unspecified trimester, not applicable or unspecified: Secondary | ICD-10-CM

## 2011-07-20 DIAGNOSIS — O4100X0 Oligohydramnios, unspecified trimester, not applicable or unspecified: Secondary | ICD-10-CM

## 2011-07-20 MED ORDER — NALBUPHINE SYRINGE 5 MG/0.5 ML
10.0000 mg | INJECTION | Freq: Once | INTRAMUSCULAR | Status: AC
  Administered 2011-07-20: 10 mg via INTRAMUSCULAR
  Filled 2011-07-20: qty 1

## 2011-07-20 MED ORDER — HYDROXYZINE HCL 50 MG/ML IM SOLN: 50.0000 mg | Freq: Once | INTRAMUSCULAR | Status: DC

## 2011-07-20 NOTE — MAU Note (Signed)
Contractions worse today, doesn't know how frequent they are or when they started. Denies vaginal bleeding or leaking of fluid. Reports positive fetal movement.

## 2011-07-20 NOTE — MAU Provider Note (Signed)
Chief Complaint:  Contractions   None     HPI  Tanya Maxwell is a 36 y.o. G2P0010 at [redacted]w[redacted]d presenting with contractions. Denies contractions, leakage of fluid or vaginal bleeding. Good fetal movement. At last visit 07/17/11 BPP was 6/8 w/ points off for low fluid. IOL was recommended, but pt declined.    Pregnancy Course: uncomplicated Patient Active Problem List  Diagnoses  . Supervision of normal first pregnancy  . Advanced maternal age in pregnancy  . Migraine with aura  . Anxiety  . Heartburn in pregnancy  . Post-dates pregnancy   Past Medical History: Past Medical History  Diagnosis Date  . Migraines     since 1996    Past Surgical History: Past Surgical History  Procedure Date  . Liposuction 01/2010    Family History: Family History  Problem Relation Age of Onset  . Heart disease Mother   . Diabetes Mother   . Hypertension Mother   . Alcohol abuse Father   . Cancer Maternal Uncle     lymphoma  . Hypertension Paternal Grandmother   . Gout Paternal Grandmother   . Asthma Paternal Grandmother   . Anesthesia problems Neg Hx     Social History: History  Substance Use Topics  . Smoking status: Never Smoker   . Smokeless tobacco: Never Used  . Alcohol Use: No    Allergies: No Known Allergies  Meds:  Prescriptions prior to admission  Medication Sig Dispense Refill  . acetaminophen (TYLENOL) 500 MG tablet Take 1,000 mg by mouth daily as needed. For pain      . Prenatal Vit-Fe Fumarate-FA (PRENATAL MULTIVITAMIN) TABS Take 1 tablet by mouth daily.      . ranitidine (ZANTAC) 150 MG tablet Take 1 tablet (150 mg total) by mouth 2 (two) times daily.  60 tablet  11  . SUMAtriptan (IMITREX) 100 MG tablet Take 1 tablet (100 mg total) by mouth once as needed for migraine. Ok to repeat once in 2 hours  9 tablet  2  . promethazine (PHENERGAN) 25 MG tablet Take 1 tablet (25 mg total) by mouth every 6 (six) hours as needed for nausea.  42 tablet  2  . promethazine  (PHENERGAN) 25 MG tablet Take 1 tablet (25 mg total) by mouth every 6 (six) hours as needed for nausea.  30 tablet  3      Physical Exam  Blood pressure 111/72, pulse 77, temperature 97.5 F (36.4 C), temperature source Oral, resp. rate 20, height 5\' 5"  (1.651 m), weight 75.297 kg (166 lb), last menstrual period 09/13/2010. GENERAL: Well-developed, well-nourished female in mild distress.  HEENT: normocephalic, good dentition HEART: normal rate RESP: normal effort ABDOMEN: Soft, nontender, nondistended, gravid.  EXTREMITIES: Nontender, no edema NEURO: alert and oriented  SPECULUM EXAM: Dilation: 3 Effacement (%): 80 Cervical Position: Posterior Station: -1 Presentation: Vertex Exam by:: Lucy Chris RNC  FHT:  Baseline 125 , moderate variability, accelerations present, no decelerations Contractions: irreg, mild-mod   Labs:    Imaging:    Assessment:    Plan: Check AFI    Dorathy Kinsman 07/20/2011 11:37 PM

## 2011-07-21 ENCOUNTER — Encounter (HOSPITAL_COMMUNITY): Payer: Self-pay | Admitting: Anesthesiology

## 2011-07-21 ENCOUNTER — Inpatient Hospital Stay (HOSPITAL_COMMUNITY): Payer: Non-veteran care | Admitting: Anesthesiology

## 2011-07-21 ENCOUNTER — Encounter (HOSPITAL_COMMUNITY): Payer: Self-pay

## 2011-07-21 LAB — CBC
MCHC: 33.7 g/dL (ref 30.0–36.0)
MCV: 88.8 fL (ref 78.0–100.0)
Platelets: 202 10*3/uL (ref 150–400)
RDW: 13.8 % (ref 11.5–15.5)
WBC: 8.2 10*3/uL (ref 4.0–10.5)

## 2011-07-21 MED ORDER — TERBUTALINE SULFATE 1 MG/ML IJ SOLN: 0.2500 mg | Freq: Once | INTRAMUSCULAR | Status: DC | PRN

## 2011-07-21 MED ORDER — LIDOCAINE HCL (PF) 1 % IJ SOLN
30.0000 mL | INTRAMUSCULAR | Status: DC | PRN
  Filled 2011-07-21: qty 30

## 2011-07-21 MED ORDER — EPHEDRINE 5 MG/ML INJ: 10.0000 mg | INTRAVENOUS | Status: DC | PRN

## 2011-07-21 MED ORDER — ACETAMINOPHEN 325 MG PO TABS: 650.0000 mg | ORAL_TABLET | ORAL | Status: DC | PRN

## 2011-07-21 MED ORDER — PRENATAL MULTIVITAMIN CH
1.0000 | ORAL_TABLET | Freq: Every day | ORAL | Status: DC
  Administered 2011-07-22 – 2011-07-23 (×2): 1 via ORAL
  Filled 2011-07-21 (×2): qty 1

## 2011-07-21 MED ORDER — GENTAMICIN SULFATE 40 MG/ML IJ SOLN
1.5000 mg/kg | Freq: Three times a day (TID) | INTRAVENOUS | Status: DC
  Filled 2011-07-21 (×2): qty 2.82

## 2011-07-21 MED ORDER — FAMOTIDINE 20 MG PO TABS: 20.0000 mg | ORAL_TABLET | Freq: Two times a day (BID) | ORAL | Status: DC | PRN

## 2011-07-21 MED ORDER — WITCH HAZEL-GLYCERIN EX PADS: 1.0000 "application " | MEDICATED_PAD | CUTANEOUS | Status: DC | PRN

## 2011-07-21 MED ORDER — ONDANSETRON HCL 4 MG/2ML IJ SOLN
4.0000 mg | Freq: Four times a day (QID) | INTRAMUSCULAR | Status: DC | PRN
  Administered 2011-07-21: 4 mg via INTRAVENOUS
  Filled 2011-07-21: qty 2

## 2011-07-21 MED ORDER — OXYTOCIN 20 UNITS IN LACTATED RINGERS INFUSION - SIMPLE
1.0000 m[IU]/min | INTRAVENOUS | Status: DC
  Administered 2011-07-21: 2 m[IU]/min via INTRAVENOUS
  Filled 2011-07-21: qty 1000

## 2011-07-21 MED ORDER — CITRIC ACID-SODIUM CITRATE 334-500 MG/5ML PO SOLN: 30.0000 mL | ORAL | Status: DC | PRN

## 2011-07-21 MED ORDER — PHENYLEPHRINE 40 MCG/ML (10ML) SYRINGE FOR IV PUSH (FOR BLOOD PRESSURE SUPPORT): 80.0000 ug | PREFILLED_SYRINGE | INTRAVENOUS | Status: DC | PRN

## 2011-07-21 MED ORDER — DIPHENHYDRAMINE HCL 50 MG/ML IJ SOLN: 12.5000 mg | INTRAMUSCULAR | Status: DC | PRN

## 2011-07-21 MED ORDER — FENTANYL 2.5 MCG/ML BUPIVACAINE 1/10 % EPIDURAL INFUSION (WH - ANES)
INTRAMUSCULAR | Status: DC | PRN
  Administered 2011-07-21: 14 mL/h via EPIDURAL

## 2011-07-21 MED ORDER — PHENYLEPHRINE 40 MCG/ML (10ML) SYRINGE FOR IV PUSH (FOR BLOOD PRESSURE SUPPORT)
80.0000 ug | PREFILLED_SYRINGE | INTRAVENOUS | Status: DC | PRN
  Filled 2011-07-21: qty 5

## 2011-07-21 MED ORDER — DIBUCAINE 1 % RE OINT: 1.0000 "application " | TOPICAL_OINTMENT | RECTAL | Status: DC | PRN

## 2011-07-21 MED ORDER — TETANUS-DIPHTH-ACELL PERTUSSIS 5-2.5-18.5 LF-MCG/0.5 IM SUSP: 0.5000 mL | Freq: Once | INTRAMUSCULAR | Status: DC

## 2011-07-21 MED ORDER — SODIUM BICARBONATE 8.4 % IV SOLN
INTRAVENOUS | Status: DC | PRN
  Administered 2011-07-21: 4 mL via EPIDURAL

## 2011-07-21 MED ORDER — OXYCODONE-ACETAMINOPHEN 5-325 MG PO TABS
1.0000 | ORAL_TABLET | ORAL | Status: DC | PRN
  Administered 2011-07-21 – 2011-07-22 (×3): 1 via ORAL
  Filled 2011-07-21 (×3): qty 1

## 2011-07-21 MED ORDER — ONDANSETRON HCL 4 MG PO TABS: 4.0000 mg | ORAL_TABLET | ORAL | Status: DC | PRN

## 2011-07-21 MED ORDER — LACTATED RINGERS IV SOLN: 500.0000 mL | Freq: Once | INTRAVENOUS | Status: DC

## 2011-07-21 MED ORDER — LACTATED RINGERS IV SOLN
500.0000 mL | INTRAVENOUS | Status: DC | PRN
  Administered 2011-07-21: 500 mL via INTRAVENOUS

## 2011-07-21 MED ORDER — IBUPROFEN 600 MG PO TABS
600.0000 mg | ORAL_TABLET | Freq: Four times a day (QID) | ORAL | Status: DC
  Administered 2011-07-22 – 2011-07-23 (×6): 600 mg via ORAL
  Filled 2011-07-21 (×6): qty 1

## 2011-07-21 MED ORDER — OXYTOCIN 20 UNITS IN LACTATED RINGERS INFUSION - SIMPLE: 125.0000 mL/h | Freq: Once | INTRAVENOUS | Status: DC

## 2011-07-21 MED ORDER — ACETAMINOPHEN 500 MG PO TABS
1000.0000 mg | ORAL_TABLET | Freq: Once | ORAL | Status: AC
  Administered 2011-07-21: 1000 mg via ORAL

## 2011-07-21 MED ORDER — DIPHENHYDRAMINE HCL 25 MG PO CAPS: 25.0000 mg | ORAL_CAPSULE | Freq: Four times a day (QID) | ORAL | Status: DC | PRN

## 2011-07-21 MED ORDER — IBUPROFEN 600 MG PO TABS: 600.0000 mg | ORAL_TABLET | Freq: Four times a day (QID) | ORAL | Status: DC | PRN

## 2011-07-21 MED ORDER — FLEET ENEMA 7-19 GM/118ML RE ENEM: 1.0000 | ENEMA | RECTAL | Status: DC | PRN

## 2011-07-21 MED ORDER — SODIUM CHLORIDE 0.9 % IV SOLN
2.0000 g | Freq: Four times a day (QID) | INTRAVENOUS | Status: DC
  Filled 2011-07-21 (×2): qty 2000

## 2011-07-21 MED ORDER — FENTANYL 2.5 MCG/ML BUPIVACAINE 1/10 % EPIDURAL INFUSION (WH - ANES)
7.0000 mL/h | INTRAMUSCULAR | Status: DC
  Administered 2011-07-21: 7 mL/h via EPIDURAL
  Administered 2011-07-21 (×3): 14 mL/h via EPIDURAL
  Filled 2011-07-21 (×4): qty 60

## 2011-07-21 MED ORDER — EPHEDRINE 5 MG/ML INJ
10.0000 mg | INTRAVENOUS | Status: DC | PRN
  Filled 2011-07-21: qty 4

## 2011-07-21 MED ORDER — OXYTOCIN BOLUS FROM INFUSION
500.0000 mL | Freq: Once | INTRAVENOUS | Status: AC
  Administered 2011-07-21: 500 mL via INTRAVENOUS
  Filled 2011-07-21: qty 500

## 2011-07-21 MED ORDER — LANOLIN HYDROUS EX OINT: TOPICAL_OINTMENT | CUTANEOUS | Status: DC | PRN

## 2011-07-21 MED ORDER — OXYCODONE-ACETAMINOPHEN 5-325 MG PO TABS: 1.0000 | ORAL_TABLET | ORAL | Status: DC | PRN

## 2011-07-21 MED ORDER — LACTATED RINGERS IV SOLN
INTRAVENOUS | Status: DC
  Administered 2011-07-21: 125 mL/h via INTRAVENOUS
  Administered 2011-07-21: 02:00:00 via INTRAVENOUS

## 2011-07-21 MED ORDER — SIMETHICONE 80 MG PO CHEW: 80.0000 mg | CHEWABLE_TABLET | ORAL | Status: DC | PRN

## 2011-07-21 MED ORDER — SENNOSIDES-DOCUSATE SODIUM 8.6-50 MG PO TABS
2.0000 | ORAL_TABLET | Freq: Every day | ORAL | Status: DC
  Administered 2011-07-21 – 2011-07-22 (×2): 2 via ORAL

## 2011-07-21 MED ORDER — ZOLPIDEM TARTRATE 5 MG PO TABS: 5.0000 mg | ORAL_TABLET | Freq: Every evening | ORAL | Status: DC | PRN

## 2011-07-21 MED ORDER — NALBUPHINE SYRINGE 5 MG/0.5 ML: 5.0000 mg | INJECTION | INTRAMUSCULAR | Status: DC | PRN

## 2011-07-21 MED ORDER — BENZOCAINE-MENTHOL 20-0.5 % EX AERO
1.0000 "application " | INHALATION_SPRAY | CUTANEOUS | Status: DC | PRN
  Administered 2011-07-22: 1 via TOPICAL
  Filled 2011-07-21: qty 56

## 2011-07-21 MED ORDER — ONDANSETRON HCL 4 MG/2ML IJ SOLN: 4.0000 mg | INTRAMUSCULAR | Status: DC | PRN

## 2011-07-21 NOTE — Progress Notes (Signed)
Bladder does not appear distended

## 2011-07-21 NOTE — Anesthesia Procedure Notes (Signed)

## 2011-07-21 NOTE — Progress Notes (Signed)
Syreeta A Monacelli is a 36 y.o. G2P0010 at [redacted]w[redacted]d.  Subjective: Comfortable w/ epidural  Objective: BP 111/73  Pulse 69  Temp(Src) 98 F (36.7 C) (Oral)  Resp 18  Ht 5\' 5"  (1.651 m)  Wt 75.297 kg (166 lb)  BMI 27.62 kg/m2  SpO2 100%  LMP 09/13/2010   Total I/O In: -  Out: 200 [Urine:200]  FHT:  FHR: 130 bpm, variability: moderate,  accelerations:  Present,  decelerations:  Absent UC:   regular, every 1-4 minutes SVE:   Dilation: 6.5 Effacement (%): 100 Station: 0 Exam by:: [employer] CNM  Labs: Lab Results  Component Value Date   WBC 8.2 07/21/2011   HGB 12.5 07/21/2011   HCT 37.1 07/21/2011   MCV 88.8 07/21/2011   PLT 202 07/21/2011    Assessment / Plan: Augmentation of labor, progressing well  Labor: Progressing on Pitocin, will continue to increase then AROM Preeclampsia:  NA Fetal Wellbeing:  Category I Pain Control:  Epidural I/D:  n/a Anticipated MOD:  NSVD  Donnivan Villena 07/21/2011, 6:18 AM

## 2011-07-21 NOTE — Progress Notes (Signed)
Dr talking to pt .  Pt does not want infant immediately skin to skin.  Pt prefers infant cleaned off prior to being skin to skin

## 2011-07-21 NOTE — Progress Notes (Signed)
Pt placed on bedpan and unable to void pericare done

## 2011-07-21 NOTE — Progress Notes (Signed)
Pushing with rope 

## 2011-07-21 NOTE — H&P (Signed)
I was present for the exam and agree with above.  Kimball, CNM 07/21/2011 1:04 AM

## 2011-07-21 NOTE — Anesthesia Postprocedure Evaluation (Signed)
  Anesthesia Post-op Note  Patient: Tanya Maxwell  Procedure(s) Performed: * No procedures listed *  Patient Location: PACU and Mother/Baby  Anesthesia Type: Epidural  Level of Consciousness: awake, alert  and oriented  Airway and Oxygen Therapy: Patient Spontanous Breathing    Post-op Assessment: Patient's Cardiovascular Status Stable and Respiratory Function Stable  Post-op Vital Signs: stable  Complications: No apparent anesthesia complications

## 2011-07-21 NOTE — Progress Notes (Signed)
Subjective: No complaints.  Very comfortable with epidural.  Feeling slight rectal pressure.  No LOF.  Objective: BP 122/77  Pulse 72  Temp(Src) 97.9 F (36.6 C) (Oral)  Resp 20  Ht 5\' 5"  (1.651 m)  Wt 75.297 kg (166 lb)  BMI 27.62 kg/m2  SpO2 100%  LMP 09/13/2010  FHT:  Baseline 125, moderate variability, positive accels, no decels. UC:   q2 min SVE:   9/100/+1/vtx Pitocin: 10 milliunits/min Labs: No new labs.  Assessment / Plan: 35yo G2P0010 @ 40'5 admitted for IOL for oligohydramnios.  -IOL: Making good cervical change on pitocin.  Cont to titrate.  Recheck cervix in 2 hrs or sooner if feels pressure.  -Pain control: excellent with epidural.  -FWB: vtx, GBS neg, cat 1 tracing.  -Plans: breastfeeding.  Uncertain about BCM (desires mirena but has had 2 expulsions after 2.46yrs each).  Female infant, no circ.  Tanya Maxwell  07/21/2011, 11:30 AM

## 2011-07-21 NOTE — Progress Notes (Signed)
Pt placed on bedpan and unable to void

## 2011-07-21 NOTE — Progress Notes (Signed)
Subjective: No complaints.  Very comfortable with epidural.    Objective: BP 130/63  Pulse 84  Temp(Src) 98 F (36.7 C) (Oral)  Resp 16  Ht 5\' 5"  (1.651 m)  Wt 75.297 kg (166 lb)  BMI 27.62 kg/m2  SpO2 100%  LMP 09/13/2010  FHT:  Baseline most recently 160, at times 170, moderate variability, positive accels, no decels. UC:   q2 min SVE:   10/100/+2/vtx, appears to have spontaneously ruptured sometime before 13:45, forebag AROM'd, clear, around 1500 Pitocin: 10 milliunits/min  Labs: No new labs.  Assessment / Plan: 35yo G2P0010 @ 40'5 admitted for IOL for oligohydramnios.  -IOL: Now fully dilated, on pitocin.  Ineffective practice pushing.  Turned down epidural.  Will allow to labor down briefly then re-attempt pushing.  -Pain control: excellent with epidural.  -FWB: vtx, GBS neg, cat 1 tracing all day, now episodes of cat 2 tracing due to baseline > 160, but good variability and accels, and no decels.  Maternal Tmax 100.5 axillary but right after was 99.8 oral, and 98 oral again later.  Will continue frequent temperature checks and defer making a diagnosis of chorio until true fever is documented.  Anticipate SVD.  -Plans: breastfeeding.  Uncertain about BCM (desires mirena but has had 2 expulsions after 2.60yrs each).  Female infant, no circ.  Tanya Maxwell  07/21/2011, 3:11 PM

## 2011-07-21 NOTE — Progress Notes (Signed)
Subjective: No complaints.  Very comfortable with epidural.  No LOF.  Objective: BP 116/72  Pulse 75  Temp(Src) 97.9 F (36.6 C) (Oral)  Resp 18  Ht 5\' 5"  (1.651 m)  Wt 75.297 kg (166 lb)  BMI 27.62 kg/m2  SpO2 100%  LMP 09/13/2010  FHT:  Baseline 125, moderate variability, positive accels, no decels. UC:   q2-62min SVE:   7-8/100/0/vtx/LOA Pitocin: 10 milliunits/min Labs: No new labs.  Assessment / Plan: 35yo G2P0010 @ 40'5 admitted for IOL for oligohydramnios.  -IOL: Making good cervical change on pitocin.  Cont to titrate.  Recheck cervix in 2 hrs.  -Pain control: excellent with epidural.  -FWB: vtx, GBS neg, cat 1 tracing.  -Plans: breastfeeding.  Uncertain about BCM (desires mirena but has had 2 expulsions after 2.73yrs each).  Tanya Maxwell  07/21/2011, 9:11 AM

## 2011-07-21 NOTE — Progress Notes (Signed)
[facility]

## 2011-07-21 NOTE — Anesthesia Preprocedure Evaluation (Signed)

## 2011-07-21 NOTE — Progress Notes (Signed)
Deferred assessing bladder, first time pt sleeping

## 2011-07-21 NOTE — H&P (Addendum)
Tanya Maxwell is a 36 y.o. female presenting for labor evaluation.  Worsening severity of contractions over the past 12 hours.  She reports intermittent contractions every 2 - 20 minutes.  Recently evaluated in the MAU on 5/21 for BPP noted to be 6/8 with subjectively low fluid.  Offered IOL but declined.  AFI today noted to be <5 and pt agreeable to augmentation of labor.  Maternal Medical History:  Reason for admission: Reason for admission: contractions.  Contractions: Onset was 13-24 hours ago.   Frequency: irregular.   Perceived severity is strong.    Fetal activity: Perceived fetal activity is normal.    Prenatal complications: No bleeding, cholelithiasis, HIV, hypertension, infection, IUGR, nephrolithiasis, oligohydramnios, placental abnormality, polyhydramnios, pre-eclampsia, preterm labor, substance abuse, thrombocytopenia or thrombophilia.   Prenatal Complications - Diabetes: none.   PREGNANCY OVERVIEW Genetic Screen  Normal  Anatomic [place]  Normal  Glucose Screen  116  GC / Chlamydia  Negative  GBS  Negative  Feeding Preference  Breast Feeding  Contraception  Mirena IUD   OB History    Grav Para Term Preterm Abortions TAB SAB Ect Mult Living   2 0 0 0 1 1 0 0 0 0      Past Medical History  Diagnosis Date  . Migraines     since 1996   Past Surgical History  Procedure Date  . Liposuction 01/2010   Family History: family history includes Alcohol abuse in her father; Asthma in her paternal grandmother; Cancer in her maternal uncle; Diabetes in her mother; Gout in her paternal grandmother; Heart disease in her mother; and Hypertension in her mother and paternal grandmother.  There is no history of Anesthesia problems. Social History:  reports that she has never smoked. She has never used smokeless tobacco. She reports that she does not drink alcohol or use illicit drugs.  Review of Systems  Constitutional: Negative.   HENT: Negative.   Eyes: Negative.   Respiratory:  Negative.   Cardiovascular: Negative.   Gastrointestinal: Negative.   Genitourinary: Negative.   Musculoskeletal: Negative.   Skin: Negative.   Neurological: Negative.   Endo/Heme/Allergies: Negative.   Psychiatric/Behavioral: Negative.     Dilation: 3.5 Effacement (%): 90 Station: -1 Exam by:: Lucy Chris RNC Blood pressure 111/72, pulse 77, temperature 97.5 F (36.4 C), temperature source Oral, resp. rate 20, height 5\' 5"  (1.651 m), weight 75.297 kg (166 lb), last menstrual period 09/13/2010. Exam Physical Exam  Constitutional: She appears well-developed and well-nourished. She appears distressed.  HENT:  Head: Normocephalic and atraumatic.  Eyes: Right eye exhibits no discharge. Left eye exhibits no discharge. No scleral icterus.  Cardiovascular: Normal rate and regular rhythm.   Respiratory: Effort normal. No respiratory distress.  GI: She exhibits distension and mass. There is no tenderness. There is no rebound and no guarding.  Genitourinary: Vagina normal and uterus normal.  Musculoskeletal: She exhibits no edema.  Skin: Skin is warm and dry. She is not diaphoretic.  Psychiatric: She has a normal mood and affect. Her behavior is normal. Judgment and thought content normal.    Prenatal labs: ABO, Rh: A/POS/-- (10/04 1504) Antibody: NEG (10/04 1504) Rubella: 92.6 (10/04 1504) RPR: NON REAC (02/20 1018)  HBsAg: NEGATIVE (10/04 1504)  HIV: NON REACTIVE (02/20 1018)  GBS: Negative (04/24 0000)   Assessment/Plan: Pt to L&D for augmentation of labor given low AFI. No   Andrena Mews, DO [facility] Resident - PGY-1 07/21/2011 12:48 AM

## 2011-07-21 NOTE — Progress Notes (Signed)
Talked with pt after scanning and will straight cath

## 2011-07-21 NOTE — Progress Notes (Signed)
Bladder distended per Dr Graylin Shiver

## 2011-07-21 NOTE — Progress Notes (Signed)
Bladder distended.  Urine blood tinged

## 2011-07-22 NOTE — Progress Notes (Signed)
Post Partum Day 1 Subjective: No complaints.  Tol reg diet. Ambulating. Voiding.  Lochia less than menses.  Breastfeeding going well.  Objective: Blood pressure 91/52, pulse 60, temperature 97.5 F (36.4 C), temperature source Oral, resp. rate 18, height 5\' 5"  (1.651 m), weight 166 lb (75.297 kg), last menstrual period 09/13/2010, SpO2 100.00%, unknown if currently breastfeeding.  Physical Exam:  General: alert and no distress Lochia: appropriate Uterine Fundus: firm Ext: non-tender, no edema  Labs: No new labs.  Assessment/Plan: 35yo G2 now P1011 s/p uncomplicated SVD @ 40'5 after IOL for oligohydramnios.  Labor was complicated by chorioamnionitis (diagnosed just prior to delivery based on maternal temp 100.5 and fetal tachycardia.  Pt delivered before antibiotics could be administered).  Now PPD#1, doing well.   -Postpartum course: meeting milestones.  Continue routine care.  -Chorioamnionitis:  Afebrile since delivery.  Deferring abx at this time.  -Plans: breastfeeding. Uncertain about BCM (desires mirena but has had 2 expulsions after 2.51yrs each, so will discuss with provider at 6wk PP visit and get mirena vs. Condoms.  She declines nexplanon/OCPs).  Female infant, no circ.   -Dispo: anticipate DC home tomorrow.  Tanya Maxwell  07/22/2011, 8:42 AM

## 2011-07-22 NOTE — Progress Notes (Signed)
CSW spoke briefly with pt.  Pt has hx of ADHD diagnosis, not anxiety.  She also stated she takes an antidepressant medication for migraines.  No other concerns per pt hx or RN.  Please reconsult CSW if further needs arise.   

## 2011-07-22 NOTE — Progress Notes (Signed)
Post Partum Day 1 Subjective: no complaints, up ad lib, voiding and tolerating PO  Objective: Blood pressure 91/52, pulse 60, temperature 97.5 F (36.4 C), temperature source Oral, resp. rate 18, height 5\' 5"  (1.651 m), weight 166 lb (75.297 kg), last menstrual period 09/13/2010, SpO2 100.00%, unknown if currently breastfeeding.  Physical Exam:  General: alert, cooperative and no distress Lochia: appropriate Uterine Fundus: firm Incision: na DVT Evaluation: No evidence of DVT seen on physical exam.   Basename 07/21/11 0150  HGB 12.5  HCT 37.1    Assessment/Plan: Plan for discharge tomorrow and Breastfeeding   LOS: 2 days   Clavin Ruhlman H 07/22/2011, 7:22 AM

## 2011-07-23 ENCOUNTER — Inpatient Hospital Stay (HOSPITAL_COMMUNITY): Admission: RE | Admit: 2011-07-23 | Payer: PRIVATE HEALTH INSURANCE | Source: Ambulatory Visit

## 2011-07-23 MED ORDER — OXYCODONE-ACETAMINOPHEN 5-325 MG PO TABS: 1.0000 | ORAL_TABLET | ORAL | Status: AC | PRN

## 2011-07-23 MED ORDER — IBUPROFEN 600 MG PO TABS: 600.0000 mg | ORAL_TABLET | Freq: Four times a day (QID) | ORAL | Status: AC | PRN

## 2011-07-23 NOTE — Discharge Instructions (Signed)
Vaginal Delivery Care After  Change your pad on each trip to the bathroom.   Wipe gently with toilet paper during your hospital stay. Always wipe from front to back. A spray bottle with warm tap water could also be used or a towelette if available.   Place your soiled pad and toilet paper in a bathroom wastebasket with a plastic bag liner.   During your hospital stay, save any clots. If you pass a clot while on the toilet, do not flush it. Also, if your vaginal flow seems excessive to you, notify nursing personnel.   The first time you get out of bed after delivery, wait for assistance from a nurse. Do not get up alone at any time if you feel weak or dizzy.   Bend and extend your ankles forcefully so that you feel the calves of your legs get hard. Do this 6 times every hour when you are in bed and awake.   Do not sit with one foot under you, dangle your legs over the edge of the bed, or maintain a position that hinders the circulation in your legs.   Many women experience after pains for 2 to 3 days after delivery. These after pains are mild uterine contractions. Ask the nurse for a pain medication if you need something for this. Sometimes breastfeeding stimulates after pains; if you find this to be true, ask for the medication  -  hour before the next feeding.   For you and your infant's protection, do not go beyond the door(s) of the obstetric unit. Do not carry your baby in your arms in the hallway. When taking your baby to and from your room, put your baby in the bassinet and push the bassinet.   Mothers may have their babies in their room as much as they desire.  Document Released: 02/10/2000 Document Revised: 02/01/2011 Document Reviewed: 01/10/2007 [employer] Patient Information 2012 [employer]. 

## 2011-07-23 NOTE — Progress Notes (Signed)
Mom declined hepatitis B vaccine for baby

## 2011-07-23 NOTE — Progress Notes (Signed)
Post Partum Day 2 Subjective: no complaints, up ad lib, voiding and tolerating PO  Objective: Blood pressure 95/57, pulse 75, temperature 97.7 F (36.5 C), temperature source Oral, resp. rate 18, height 5\' 5"  (1.651 m), weight 166 lb (75.297 kg), last menstrual period 09/13/2010, SpO2 100.00%, unknown if currently breastfeeding.  Physical Exam:  General: alert, cooperative, appears stated age and no distress Lochia: appropriate Uterine Fundus: firm Incision: n/a DVT Evaluation: No evidence of DVT seen on physical exam. Negative Homan's sign. No cords or calf tenderness. No significant calf/ankle edema.   Basename 07/21/11 0150  HGB 12.5  HCT 37.1    Assessment/Plan: Discharge home   LOS: 3 days   Tanya Maxwell DARLENE 07/23/2011, 6:37 AM

## 2011-07-23 NOTE — Discharge Summary (Signed)
Obstetric Discharge Summary Reason for Admission: onset of labor Prenatal Procedures: ultrasound Intrapartum Procedures: spontaneous vaginal delivery Postpartum Procedures: none Complications-Operative and Postpartum: none Hemoglobin  Date Value Range Status  07/21/2011 12.5  12.0-15.0 (g/dL) Final     HCT  Date Value Range Status  07/21/2011 37.1  36.0-46.0 (%) Final    Physical Exam:  General: alert, cooperative, appears stated age and no distress Lochia: appropriate Uterine Fundus: firm Incision: n/a DVT Evaluation: No evidence of DVT seen on physical exam. Negative Homan's sign. No cords or calf tenderness. No significant calf/ankle edema.  Discharge Diagnoses: Term Pregnancy-delivered  Discharge Information: Date: 07/23/2011 Activity: pelvic rest Diet: routine Medications: tylenol Condition: stable and improved Instructions: refer to practice specific booklet Discharge to: home Follow-up Information    Follow up in 6 weeks.         Newborn Data: Live born female  Birth Weight: 6 lb 15.5 oz (3161 g) APGAR: 8, 9  Home with mother.  Tanya Maxwell 07/23/2011, 6:41 AM

## 2011-07-27 NOTE — Progress Notes (Signed)
UR Chart review completed.  

## 2011-07-27 NOTE — Progress Notes (Signed)
I examined pt and agree with documentation above and resident plan of care. MUHAMMAD,Bronwyn Belasco  

## 2011-08-02 ENCOUNTER — Encounter: Payer: Self-pay | Admitting: Obstetrics & Gynecology

## 2011-08-02 ENCOUNTER — Ambulatory Visit (INDEPENDENT_AMBULATORY_CARE_PROVIDER_SITE_OTHER): Payer: Non-veteran care | Admitting: Obstetrics & Gynecology

## 2011-08-02 NOTE — Progress Notes (Signed)
  Subjective:    Patient ID: Tanya Maxwell, female    DOB: [birth year], 36 y.o.   MRN: [id number]  HPI  She is here today because she feels vaginal discomfort "like something got stretched".   Review of Systems     Objective:   Physical Exam Entirely normal vulva/vagina/cervix       Assessment & Plan:  Reassurance given Rec'd IBU/sitz baths prn

## 2011-09-04 ENCOUNTER — Ambulatory Visit: Payer: Non-veteran care | Admitting: Family Medicine

## 2011-09-05 ENCOUNTER — Encounter: Payer: Self-pay | Admitting: Obstetrics & Gynecology

## 2011-09-05 ENCOUNTER — Ambulatory Visit (INDEPENDENT_AMBULATORY_CARE_PROVIDER_SITE_OTHER): Payer: Non-veteran care | Admitting: Obstetrics & Gynecology

## 2011-09-05 VITALS — BP 120/73 | HR 78 | Ht 65.0 in | Wt 145.0 lb

## 2011-09-05 DIAGNOSIS — Z3400 Encounter for supervision of normal first pregnancy, unspecified trimester: Secondary | ICD-10-CM

## 2011-09-05 NOTE — Progress Notes (Signed)
  Subjective:    Patient ID: Tanya Maxwell, female    DOB: 01-22-1976, 36 y.o.   MRN: [id number]  [id number] No LMP recorded. The patient had a vaginal delivery: 07/21/2011.complications. Her baby was a female 6 lbs. 15 oz. who is doing well. She is currently breast-feeding. She requests no contraceptive methods and wishes to conceive. His no complaints today. Past Medical History  Diagnosis Date  . Migraines     since 1996   Past Surgical History  Procedure Date  . Liposuction 01/2010   No Known Allergies History   Social History  . Marital Status: Married    Spouse Name: N/A    Number of Children: N/A  . Years of Education: N/A   Occupational History  . Not on file.   Social History Main Topics  . Smoking status: Never Smoker   . Smokeless tobacco: Never Used  . Alcohol Use: No  . Drug Use: No  . Sexually Active: Yes     prenant   Other Topics Concern  . Not on file   Social History Narrative  . No narrative on file   Family History  Problem Relation Age of Onset  . Heart disease Mother   . Diabetes Mother   . Hypertension Mother   . Alcohol abuse Father   . Cancer Maternal Uncle     lymphoma  . Hypertension Paternal Grandmother   . Gout Paternal Grandmother   . Asthma Paternal Grandmother   . Anesthesia problems Neg Hx    Current Outpatient Prescriptions on File Prior to Visit  Medication Sig Dispense Refill  . acetaminophen (TYLENOL) 500 MG tablet Take 1,000 mg by mouth daily as needed. For pain      . Prenatal Vit-Fe Fumarate-FA (PRENATAL MULTIVITAMIN) TABS Take 1 tablet by mouth daily.      . ranitidine (ZANTAC) 150 MG tablet Take 1 tablet (150 mg total) by mouth 2 (two) times daily.  60 tablet  11       Review of Systems Breast-feeding is going well. No symptoms of reflux. She is sleeping well. No vaginal bleeding. She reports that her perineal laceration has healed well.    Objective:   Physical Exam Filed Vitals:   09/05/11 1011  BP:  120/73  Pulse: 78  Height: 5\' 5"  (1.651 m)  Weight: 145 lb (65.772 kg)   No acute distress, normal affect Abdomen flat soft nontender no mass Pelvic: External genitalia appear normal normal vagina. Uterus normal size nontender no adnexal masses.       Assessment & Plan:  Patient is doing well 6 weeks postpartum from vaginal delivery. She will continue to nurse her baby exclusively for 6 months. Her dementia take her prenatal vitamins. Last Pap smear was October 2012 and recommend she return in the fall for yearly examination.  Dr. Scheryl Darter 09/05/2011 10:37 AM

## 2011-09-05 NOTE — Patient Instructions (Signed)
Breastfeeding BENEFITS OF BREASTFEEDING For the baby  The first milk (colostrum) helps the baby's digestive system function better.   There are antibodies from the mother in the milk that help the baby fight off infections.   The baby has a lower incidence of asthma, allergies, and SIDS (sudden infant death syndrome).   The nutrients in breast milk are better than formulas for the baby and helps the baby's brain grow better.   Babies who breastfeed have less gas, colic, and constipation.  For the mother  Breastfeeding helps develop a very special bond between mother and baby.   It is more convenient, always available at the correct temperature and cheaper than formula feeding.   It burns calories in the mother and helps with losing weight that was gained during pregnancy.   It makes the uterus contract back down to normal size faster and slows bleeding following delivery.   Breastfeeding mothers have a lower risk of developing breast cancer.  NURSE FREQUENTLY  A healthy, full-term baby may breastfeed as often as every hour or space his or her feedings to every 3 hours.   How often to nurse will vary from baby to baby. Watch your baby for signs of hunger, not the clock.   Nurse as often as the baby requests, or when you feel the need to reduce the fullness of your breasts.   Awaken the baby if it has been 3 to 4 hours since the last feeding.   Frequent feeding will help the mother make more milk and will prevent problems like sore nipples and engorgement of the breasts.  BABY'S POSITION AT THE BREAST  Whether lying down or sitting, be sure that the baby's tummy is facing your tummy.   Support the breast with 4 fingers underneath the breast and the thumb above. Make sure your fingers are well away from the nipple and baby's mouth.   Stroke the baby's lips and cheek closest to the breast gently with your finger or nipple.   When the baby's mouth is open wide enough, place all  of your nipple and as much of the dark area around the nipple as possible into your baby's mouth.   Pull the baby in close so the tip of the nose and the baby's cheeks touch the breast during the feeding.  FEEDINGS  The length of each feeding varies from baby to baby and from feeding to feeding.   The baby must suck about 2 to 3 minutes for your milk to get to him or her. This is called a "let down." For this reason, allow the baby to feed on each breast as long as he or she wants. Your baby will end the feeding when he or she has received the right balance of nutrients.   To break the suction, put your finger into the corner of the baby's mouth and slide it between his or her gums before removing your breast from his or her mouth. This will help prevent sore nipples.  REDUCING BREAST ENGORGEMENT  In the first week after your baby is born, you may experience signs of breast engorgement. When breasts are engorged, they feel heavy, warm, full, and may be tender to the touch. You can reduce engorgement if you:   Nurse frequently, every 2 to 3 hours. Mothers who breastfeed early and often have fewer problems with engorgement.   Place light ice packs on your breasts between feedings. This reduces swelling. Wrap the ice packs in a   lightweight towel to protect your skin.   Apply moist hot packs to your breast for 5 to 10 minutes before each feeding. This increases circulation and helps the milk flow.   Gently massage your breast before and during the feeding.   Make sure that the baby empties at least one breast at every feeding before switching sides.   Use a breast pump to empty the breasts if your baby is sleepy or not nursing well. You may also want to pump if you are returning to work or or you feel you are getting engorged.   Avoid bottle feeds, pacifiers or supplemental feedings of water or juice in place of breastfeeding.   Be sure the baby is latched on and positioned properly while  breastfeeding.   Prevent fatigue, stress, and anemia.   Wear a supportive bra, avoiding underwire styles.   Eat a balanced diet with enough fluids.  If you follow these suggestions, your engorgement should improve in 24 to 48 hours. If you are still experiencing difficulty, call your lactation consultant or caregiver. IS MY BABY GETTING ENOUGH MILK? Sometimes, mothers worry about whether their babies are getting enough milk. You can be assured that your baby is getting enough milk if:  The baby is actively sucking and you hear swallowing.   The baby nurses at least 8 to 12 times in a 24 hour time period. Nurse your baby until he or she unlatches or falls asleep at the first breast (at least 10 to 20 minutes), then offer the second side.   The baby is wetting 5 to 6 disposable diapers (6 to 8 cloth diapers) in a 24 hour period by 5 to 6 days of age.   The baby is having at least 2 to 3 stools every 24 hours for the first few months. Breast milk is all the food your baby needs. It is not necessary for your baby to have water or formula. In fact, to help your breasts make more milk, it is best not to give your baby supplemental feedings during the early weeks.   The stool should be soft and yellow.   The baby should gain 4 to 7 ounces per week after he is 4 days old.  TAKE CARE OF YOURSELF Take care of your breasts by:  Bathing or showering daily.   Avoiding the use of soaps on your nipples.   Start feedings on your left breast at one feeding and on your right breast at the next feeding.   You will notice an increase in your milk supply 2 to 5 days after delivery. You may feel some discomfort from engorgement, which makes your breasts very firm and often tender. Engorgement "peaks" out within 24 to 48 hours. In the meantime, apply warm moist towels to your breasts for 5 to 10 minutes before feeding. Gentle massage and expression of some milk before feeding will soften your breasts, making  it easier for your baby to latch on. Wear a well fitting nursing bra and air dry your nipples for 10 to 15 minutes after each feeding.   Only use cotton bra pads.   Only use pure lanolin on your nipples after nursing. You do not need to wash it off before nursing.  Take care of yourself by:   Eating well-balanced meals and nutritious snacks.   Drinking milk, fruit juice, and water to satisfy your thirst (about 8 glasses a day).   Getting plenty of rest.   Increasing calcium in   your diet (1200 mg a day).   Avoiding foods that you notice affect the baby in a bad way.  SEEK MEDICAL CARE IF:   You have any questions or difficulty with breastfeeding.   You need help.   You have a hard, red, sore area on your breast, accompanied by a fever of 100.5 F (38.1 C) or more.   Your baby is too sleepy to eat well or is having trouble sleeping.   Your baby is wetting less than 6 diapers per day, by 5 days of age.   Your baby's skin or white part of his or her eyes is more yellow than it was in the hospital.   You feel depressed.  Document Released: 02/12/2005 Document Revised: 02/01/2011 Document Reviewed: 09/27/2008 [employer] Patient Information 2012 [employer]. 

## 2012-02-27 NOTE — L&D Delivery Note (Signed)
Delivery Note At 3:04 PM a viable female was delivered via NSVD (presentation OA) over an intact perineum.  APGAR: 9, 9; weight pending.   Placenta status: Spontaneously delivered with marginal cord.  Cord: 3VC wrapped around arm/trunk, cord blood sent.  Cord pH: not obtained.  Anesthesia: None Episiotomy: None Lacerations: hemostatic 1st degree vaginal tear Suture Repair: Not repaired Est. Blood Loss (mL): 300  Mom to postpartum.  Baby to Couplet care / Skin to Skin.  [name] 01/21/2013, 3:20 PM  I was present for delivery and agree with note above. [facility]

## 2012-02-27 NOTE — L&D Delivery Note (Signed)
Attestation of Attending Supervision of Advanced Practitioner (CNM/NP): Evaluation and management procedures were performed by the Advanced Practitioner under my supervision and collaboration.  I have reviewed the Advanced Practitioner's note and chart, and I agree with the management and plan.  HARRAWAY-SMITH, Ayven Pheasant 2:42 PM     

## 2012-07-03 ENCOUNTER — Ambulatory Visit (INDEPENDENT_AMBULATORY_CARE_PROVIDER_SITE_OTHER): Payer: Non-veteran care | Admitting: *Deleted

## 2012-07-03 ENCOUNTER — Encounter: Payer: Self-pay | Admitting: *Deleted

## 2012-07-03 VITALS — BP 114/67 | Wt 145.0 lb

## 2012-07-03 DIAGNOSIS — O09529 Supervision of elderly multigravida, unspecified trimester: Secondary | ICD-10-CM

## 2012-07-03 DIAGNOSIS — Z3480 Encounter for supervision of other normal pregnancy, unspecified trimester: Secondary | ICD-10-CM

## 2012-07-03 DIAGNOSIS — O09521 Supervision of elderly multigravida, first trimester: Secondary | ICD-10-CM

## 2012-07-03 NOTE — Patient Instructions (Signed)
Pregnancy - First Trimester During sexual intercourse, millions of sperm go into the vagina. Only 1 sperm will penetrate and fertilize the female egg while it is in the Fallopian tube. One week later, the fertilized egg implants into the wall of the uterus. An embryo begins to develop into a baby. At 6 to 8 weeks, the eyes and face are formed and the heartbeat can be seen on ultrasound. At the end of 12 weeks (first trimester), all the baby's organs are formed. Now that you are pregnant, you will want to do everything you can to have a healthy baby. Two of the most important things are to get good prenatal care and follow your caregiver's instructions. Prenatal care is all the medical care you receive before the baby's birth. It is given to prevent, find, and treat problems during the pregnancy and childbirth. PRENATAL EXAMS  During prenatal visits, your weight, blood pressure and urine are checked. This is done to make sure you are healthy and progressing normally during the pregnancy.  A pregnant woman should gain 25 to 35 pounds during the pregnancy. However, if you are over weight or underweight, your caregiver will advise you regarding your weight.  Your caregiver will ask and answer questions for you.  Blood work, cervical cultures, other necessary tests and a Pap test are done during your prenatal exams. These tests are done to check on your health and the probable health of your baby. Tests are strongly recommended and done for HIV with your permission. This is the virus that causes AIDS. These tests are done because medications can be given to help prevent your baby from being born with this infection should you have been infected without knowing it. Blood work is also used to find out your blood type, previous infections and follow your blood levels (hemoglobin).  Low hemoglobin (anemia) is common during pregnancy. Iron and vitamins are given to help prevent this. Later in the pregnancy, blood  tests for diabetes will be done along with any other tests if any problems develop. You may need tests to make sure you and the baby are doing well.  You may need other tests to make sure you and the baby are doing well. CHANGES DURING THE FIRST TRIMESTER (THE FIRST 3 MONTHS OF PREGNANCY) Your body goes through many changes during pregnancy. They vary from person to person. Talk to your caregiver about changes you notice and are concerned about. Changes can include:  Your menstrual period stops.  The egg and sperm carry the genes that determine what you look like. Genes from you and your partner are forming a baby. The female genes determine whether the baby is a boy or a girl.  Your body increases in girth and you may feel bloated.  Feeling sick to your stomach (nauseous) and throwing up (vomiting). If the vomiting is uncontrollable, call your caregiver.  Your breasts will begin to enlarge and become tender.  Your nipples may stick out more and become darker.  The need to urinate more. Painful urination may mean you have a bladder infection.  Tiring easily.  Loss of appetite.  Cravings for certain kinds of food.  At first, you may gain or lose a couple of pounds.  You may have changes in your emotions from day to day (excited to be pregnant or concerned something may go wrong with the pregnancy and baby).  You may have more vivid and strange dreams. HOME CARE INSTRUCTIONS   It is very important   to avoid all smoking, alcohol and un-prescribed drugs during your pregnancy. These affect the formation and growth of the baby. Avoid chemicals while pregnant to ensure the delivery of a healthy infant.  Start your prenatal visits by the 12th week of pregnancy. They are usually scheduled monthly at first, then more often in the last 2 months before delivery. Keep your caregiver's appointments. Follow your caregiver's instructions regarding medication use, blood and lab tests, exercise, and  diet.  During pregnancy, you are providing food for you and your baby. Eat regular, well-balanced meals. Choose foods such as meat, fish, milk and other low fat dairy products, vegetables, fruits, and whole-grain breads and cereals. Your caregiver will tell you of the ideal weight gain.  You can help morning sickness by keeping soda crackers at the bedside. Eat a couple before arising in the morning. You may want to use the crackers without salt on them.  Eating 4 to 5 small meals rather than 3 large meals a day also may help the nausea and vomiting.  Drinking liquids between meals instead of during meals also seems to help nausea and vomiting.  A physical sexual relationship may be continued throughout pregnancy if there are no other problems. Problems may be early (premature) leaking of amniotic fluid from the membranes, vaginal bleeding, or belly (abdominal) pain.  Exercise regularly if there are no restrictions. Check with your caregiver or physical therapist if you are unsure of the safety of some of your exercises. Greater weight gain will occur in the last 2 trimesters of pregnancy. Exercising will help:  Control your weight.  Keep you in shape.  Prepare you for labor and delivery.  Help you lose your pregnancy weight after you deliver your baby.  Wear a good support or jogging bra for breast tenderness during pregnancy. This may help if worn during sleep too.  Ask when prenatal classes are available. Begin classes when they are offered.  Do not use hot tubs, steam rooms or saunas.  Wear your seat belt when driving. This protects you and your baby if you are in an accident.  Avoid raw meat, uncooked cheese, cat litter boxes and soil used by cats throughout the pregnancy. These carry germs that can cause birth defects in the baby.  The first trimester is a good time to visit your dentist for your dental health. Getting your teeth cleaned is OK. Use a softer toothbrush and brush  gently during pregnancy.  Ask for help if you have financial, counseling or nutritional needs during pregnancy. Your caregiver will be able to offer counseling for these needs as well as refer you for other special needs.  Do not take any medications or herbs unless told by your caregiver.  Inform your caregiver if there is any mental or physical domestic violence.  Make a list of emergency phone numbers of family, friends, hospital, and police and fire departments.  Write down your questions. Take them to your prenatal visit.  Do not douche.  Do not cross your legs.  If you have to stand for long periods of time, rotate you feet or take small steps in a circle.  You may have more vaginal secretions that may require a sanitary pad. Do not use tampons or scented sanitary pads. MEDICATIONS AND DRUG USE IN PREGNANCY  Take prenatal vitamins as directed. The vitamin should contain 1 milligram of folic acid. Keep all vitamins out of reach of children. Only a couple vitamins or tablets containing iron may be   fatal to a baby or young child when ingested.  Avoid use of all medications, including herbs, over-the-counter medications, not prescribed or suggested by your caregiver. Only take over-the-counter or prescription medicines for pain, discomfort, or fever as directed by your caregiver. Do not use aspirin, ibuprofen, or naproxen unless directed by your caregiver.  Let your caregiver also know about herbs you may be using.  Alcohol is related to a number of birth defects. This includes fetal alcohol syndrome. All alcohol, in any form, should be avoided completely. Smoking will cause low birth rate and premature babies.  Street or illegal drugs are very harmful to the baby. They are absolutely forbidden. A baby born to an addicted mother will be addicted at birth. The baby will go through the same withdrawal an adult does.  Let your caregiver know about any medications that you have to take  and for what reason you take them. MISCARRIAGE IS COMMON DURING PREGNANCY A miscarriage does not mean you did something wrong. It is not a reason to worry about getting pregnant again. Your caregiver will help you with questions you may have. If you have a miscarriage, you may need minor surgery. SEEK MEDICAL CARE IF:  You have any concerns or worries during your pregnancy. It is better to call with your questions if you feel they cannot wait, rather than worry about them. SEEK IMMEDIATE MEDICAL CARE IF:   An unexplained oral temperature above 102 F (38.9 C) develops, or as your caregiver suggests.  You have leaking of fluid from the vagina (birth canal). If leaking membranes are suspected, take your temperature and inform your caregiver of this when you call.  There is vaginal spotting or bleeding. Notify your caregiver of the amount and how many pads are used.  You develop a bad smelling vaginal discharge with a change in the color.  You continue to feel sick to your stomach (nauseated) and have no relief from remedies suggested. You vomit blood or coffee ground-like materials.  You lose more than 2 pounds of weight in 1 week.  You gain more than 2 pounds of weight in 1 week and you notice swelling of your face, hands, feet, or legs.  You gain 5 pounds or more in 1 week (even if you do not have swelling of your hands, face, legs, or feet).  You get exposed to German measles and have never had them.  You are exposed to fifth disease or chickenpox.  You develop belly (abdominal) pain. Round ligament discomfort is a common non-cancerous (benign) cause of abdominal pain in pregnancy. Your caregiver still must evaluate this.  You develop headache, fever, diarrhea, pain with urination, or shortness of breath.  You fall or are in a car accident or have any kind of trauma.  There is mental or physical violence in your home. Document Released: 02/06/2001 Document Revised: 05/07/2011  Document Reviewed: 08/10/2008 [employer] Patient Information 2013 [employer].  

## 2012-07-08 ENCOUNTER — Other Ambulatory Visit: Payer: Self-pay | Admitting: Family Medicine

## 2012-07-08 DIAGNOSIS — Z3682 Encounter for antenatal screening for nuchal translucency: Secondary | ICD-10-CM

## 2012-07-11 ENCOUNTER — Ambulatory Visit (HOSPITAL_COMMUNITY)
Admission: RE | Admit: 2012-07-11 | Discharge: 2012-07-11 | Disposition: A | Discharge status: Home / Self Care | Payer: Non-veteran care | Source: Ambulatory Visit | Attending: Family Medicine | Admitting: Family Medicine

## 2012-07-11 ENCOUNTER — Other Ambulatory Visit (HOSPITAL_COMMUNITY): Payer: Non-veteran care

## 2012-07-11 ENCOUNTER — Other Ambulatory Visit: Payer: Self-pay

## 2012-07-11 VITALS — BP 109/59 | HR 75 | Wt 144.0 lb

## 2012-07-11 DIAGNOSIS — Z3682 Encounter for antenatal screening for nuchal translucency: Secondary | ICD-10-CM

## 2012-07-11 DIAGNOSIS — O3510X0 Maternal care for (suspected) chromosomal abnormality in fetus, unspecified, not applicable or unspecified: Secondary | ICD-10-CM | POA: Insufficient documentation

## 2012-07-11 DIAGNOSIS — O09529 Supervision of elderly multigravida, unspecified trimester: Secondary | ICD-10-CM | POA: Insufficient documentation

## 2012-07-11 DIAGNOSIS — O351XX0 Maternal care for (suspected) chromosomal abnormality in fetus, not applicable or unspecified: Secondary | ICD-10-CM | POA: Insufficient documentation

## 2012-07-11 DIAGNOSIS — Z3689 Encounter for other specified antenatal screening: Secondary | ICD-10-CM | POA: Insufficient documentation

## 2012-07-11 NOTE — Progress Notes (Signed)
Tanya Maxwell  was seen today for an ultrasound appointment.  See full report in [facility].  Impression: Single IUP at 13 2/7 weeks Advanced maternal age Normal NT, nasal bone visualized  After counseling, the patient elected to undergo NIPS (cell free fetal DNA testing)  Recommendations: Please do not draw triple/quad screen, though patient should be offered MSAFP for neural tube defect screening.  Recommend ultrasound for fetal anatomy at [redacted] weeks gestation  Alpha Gula, MD

## 2012-07-12 ENCOUNTER — Encounter: Payer: Self-pay | Admitting: Family Medicine

## 2012-07-17 ENCOUNTER — Other Ambulatory Visit (HOSPITAL_COMMUNITY)
Admission: RE | Admit: 2012-07-17 | Discharge: 2012-07-17 | Disposition: A | Discharge status: Home / Self Care | Payer: Non-veteran care | Source: Ambulatory Visit | Attending: Family Medicine | Admitting: Family Medicine

## 2012-07-17 ENCOUNTER — Ambulatory Visit (INDEPENDENT_AMBULATORY_CARE_PROVIDER_SITE_OTHER): Payer: Non-veteran care | Admitting: Family Medicine

## 2012-07-17 ENCOUNTER — Encounter: Payer: Self-pay | Admitting: Family Medicine

## 2012-07-17 DIAGNOSIS — Z1151 Encounter for screening for human papillomavirus (HPV): Secondary | ICD-10-CM | POA: Insufficient documentation

## 2012-07-17 DIAGNOSIS — Z113 Encounter for screening for infections with a predominantly sexual mode of transmission: Secondary | ICD-10-CM | POA: Insufficient documentation

## 2012-07-17 DIAGNOSIS — O09529 Supervision of elderly multigravida, unspecified trimester: Secondary | ICD-10-CM

## 2012-07-17 DIAGNOSIS — Z3402 Encounter for supervision of normal first pregnancy, second trimester: Secondary | ICD-10-CM

## 2012-07-17 DIAGNOSIS — Z3400 Encounter for supervision of normal first pregnancy, unspecified trimester: Secondary | ICD-10-CM

## 2012-07-17 DIAGNOSIS — Z124 Encounter for screening for malignant neoplasm of cervix: Secondary | ICD-10-CM

## 2012-07-17 DIAGNOSIS — Z01419 Encounter for gynecological examination (general) (routine) without abnormal findings: Secondary | ICD-10-CM | POA: Insufficient documentation

## 2012-07-17 NOTE — Progress Notes (Signed)
   Subjective:    Tanya Maxwell is a G3P1011 [redacted]w[redacted]d being seen today for her first obstetrical visit.  Her obstetrical history is significant for advanced maternal age. Patient does intend to breast feed. Pregnancy history fully reviewed.  Patient reports nausea.  Filed Vitals:   07/17/12 0846  BP: 114/73  Weight: 143 lb (64.864 kg)    HISTORY: OB History   Grav Para Term Preterm Abortions TAB SAB Ect Mult Living   3 1 1  0 1 1 0 0 0 1     # Outc Date GA Lbr Len/2nd Wgt Sex Del Anes PTL Lv   1 TRM 5/13 [redacted]w[redacted]d 10:52 / 01:57 6lb15.5oz(3.161kg) M SVD EPI  Yes   Comments: WNL   2 TAB            3 CUR              Past Medical History  Diagnosis Date  . Migraines     since 1996   Past Surgical History  Procedure Laterality Date  . Liposuction  01/2010   Family History  Problem Relation Age of Onset  . Heart disease Mother   . Diabetes Mother   . Hypertension Mother   . Stroke Mother   . Alcohol abuse Father   . Cancer Maternal Uncle     lymphoma  . Hypertension Paternal Grandmother   . Gout Paternal Grandmother   . Asthma Paternal Grandmother   . Anesthesia problems Neg Hx      Exam    Uterus:     Pelvic Exam:    Perineum: Normal Perineum   Vulva: Bartholin's, Urethra, Skene's normal   Vagina:  normal mucosa   Cervix: multiparous appearance, no cervical motion tenderness and no lesions   Adnexa: normal adnexa   Bony Pelvis: average  System: Breast:  normal appearance, no masses or tenderness   Skin: normal coloration and turgor, no rashes    Neurologic: oriented   Extremities: normal strength, tone, and muscle mass   HEENT sclera clear, anicteric   Mouth/Teeth mucous membranes moist, pharynx normal without lesions   Neck supple   Cardiovascular: regular rate and rhythm, no murmurs or gallops   Respiratory:  appears well, vitals normal, no respiratory distress, acyanotic, normal RR, ear and throat exam is normal, neck free of mass or lymphadenopathy,  chest clear, no wheezing, crepitations, rhonchi, normal symmetric air entry   Abdomen: soft, non-tender; bowel sounds normal; no masses,  no organomegaly   Uterus: 14 wk size      Assessment:    Pregnancy: [id number] Patient Active Problem List   Diagnosis Date Noted  . Supervision of normal first pregnancy 12/13/2010    Priority: High  . Advanced maternal age in pregnancy 12/13/2010    Priority: Medium  . Heartburn in pregnancy 06/12/2011  . Migraine with aura 12/26/2010  . Anxiety 12/26/2010        Plan:     Initial labs drawn. Prenatal vitamins. Problem list reviewed and updated. Genetic Screening discussed First Screen: results reviewed.  Ultrasound discussed; fetal survey: ordered.  Follow up in 4 weeks. New OB labs from previous provider.     Krystel Fletchall S 07/17/2012

## 2012-07-17 NOTE — Patient Instructions (Signed)
Pregnancy - Second Trimester The second trimester of pregnancy (3 to 6 months) is a period of rapid growth for you and your baby. At the end of the sixth month, your baby is about 9 inches long and weighs 1 1/2 pounds. You will begin to feel the baby move between 18 and 20 weeks of the pregnancy. This is called quickening. Weight gain is faster. A clear fluid (colostrum) may leak out of your breasts. You may feel small contractions of the womb (uterus). This is known as false labor or Braxton-Hicks contractions. This is like a practice for labor when the baby is ready to be born. Usually, the problems with morning sickness have usually passed by the end of your first trimester. Some women develop small dark blotches (called cholasma, mask of pregnancy) on their face that usually goes away after the baby is born. Exposure to the sun makes the blotches worse. Acne may also develop in some pregnant women and pregnant women who have acne, may find that it goes away. PRENATAL EXAMS  Blood work may continue to be done during prenatal exams. These tests are done to check on your health and the probable health of your baby. Blood work is used to follow your blood levels (hemoglobin). Anemia (low hemoglobin) is common during pregnancy. Iron and vitamins are given to help prevent this. You will also be checked for diabetes between 24 and 28 weeks of the pregnancy. Some of the previous blood tests may be repeated.  The size of the uterus is measured during each visit. This is to make sure that the baby is continuing to grow properly according to the dates of the pregnancy.  Your blood pressure is checked every prenatal visit. This is to make sure you are not getting toxemia.  Your urine is checked to make sure you do not have an infection, diabetes or protein in the urine.  Your weight is checked often to make sure gains are happening at the suggested rate. This is to ensure that both you and your baby are  growing normally.  Sometimes, an ultrasound is performed to confirm the proper growth and development of the baby. This is a test which bounces harmless sound waves off the baby so your caregiver can more accurately determine due dates. Sometimes, a test is done on the amniotic fluid surrounding the baby. This test is called an amniocentesis. The amniotic fluid is obtained by sticking a needle into the belly (abdomen). This is done to check the chromosomes in instances where there is a concern about possible genetic problems with the baby. It is also sometimes done near the end of pregnancy if an early delivery is required. In this case, it is done to help make sure the baby's lungs are mature enough for the baby to live outside of the womb. CHANGES OCCURING IN THE SECOND TRIMESTER OF PREGNANCY Your body goes through many changes during pregnancy. They vary from person to person. Talk to your caregiver about changes you notice that you are concerned about.  During the second trimester, you will likely have an increase in your appetite. It is normal to have cravings for certain foods. This varies from person to person and pregnancy to pregnancy.  Your lower abdomen will begin to bulge.  You may have to urinate more often because the uterus and baby are pressing on your bladder. It is also common to get more bladder infections during pregnancy. You can help this by drinking lots of fluids   and emptying your bladder before and after intercourse.  You may begin to get stretch marks on your hips, abdomen, and breasts. These are normal changes in the body during pregnancy. There are no exercises or medicines to take that prevent this change.  You may begin to develop swollen and bulging veins (varicose veins) in your legs. Wearing support hose, elevating your feet for 15 minutes, 3 to 4 times a day and limiting salt in your diet helps lessen the problem.  Heartburn may develop as the uterus grows and  pushes up against the stomach. Antacids recommended by your caregiver helps with this problem. Also, eating smaller meals 4 to 5 times a day helps.  Constipation can be treated with a stool softener or adding bulk to your diet. Drinking lots of fluids, and eating vegetables, fruits, and whole grains are helpful.  Exercising is also helpful. If you have been very active up until your pregnancy, most of these activities can be continued during your pregnancy. If you have been less active, it is helpful to start an exercise program such as walking.  Hemorrhoids may develop at the end of the second trimester. Warm sitz baths and hemorrhoid cream recommended by your caregiver helps hemorrhoid problems.  Backaches may develop during this time of your pregnancy. Avoid heavy lifting, wear low heal shoes, and practice good posture to help with backache problems.  Some pregnant women develop tingling and numbness of their hand and fingers because of swelling and tightening of ligaments in the wrist (carpel tunnel syndrome). This goes away after the baby is born.  As your breasts enlarge, you may have to get a bigger bra. Get a comfortable, cotton, support bra. Do not get a nursing bra until the last month of the pregnancy if you will be nursing the baby.  You may get a dark line from your belly button to the pubic area called the linea nigra.  You may develop rosy cheeks because of increase blood flow to the face.  You may develop spider looking lines of the face, neck, arms, and chest. These go away after the baby is born. HOME CARE INSTRUCTIONS   It is extremely important to avoid all smoking, herbs, alcohol, and unprescribed drugs during your pregnancy. These chemicals affect the formation and growth of the baby. Avoid these chemicals throughout the pregnancy to ensure the delivery of a healthy infant.  Most of your home care instructions are the same as suggested for the first trimester of your  pregnancy. Keep your caregiver's appointments. Follow your caregiver's instructions regarding medicine use, exercise, and diet.  During pregnancy, you are providing food for you and your baby. Continue to eat regular, well-balanced meals. Choose foods such as meat, fish, milk and other low fat dairy products, vegetables, fruits, and whole-grain breads and cereals. Your caregiver will tell you of the ideal weight gain.  A physical sexual relationship may be continued up until near the end of pregnancy if there are no other problems. Problems could include early (premature) leaking of amniotic fluid from the membranes, vaginal bleeding, abdominal pain, or other medical or pregnancy problems.  Exercise regularly if there are no restrictions. Check with your caregiver if you are unsure of the safety of some of your exercises. The greatest weight gain will occur in the last 2 trimesters of pregnancy. Exercise will help you:  Control your weight.  Get you in shape for labor and delivery.  Lose weight after you have the baby.  Wear   a good support or jogging bra for breast tenderness during pregnancy. This may help if worn during sleep. Pads or tissues may be used in the bra if you are leaking colostrum.  Do not use hot tubs, steam rooms or saunas throughout the pregnancy.  Wear your seat belt at all times when driving. This protects you and your baby if you are in an accident.  Avoid raw meat, uncooked cheese, cat litter boxes, and soil used by cats. These carry germs that can cause birth defects in the baby.  The second trimester is also a good time to visit your dentist for your dental health if this has not been done yet. Getting your teeth cleaned is okay. Use a soft toothbrush. Brush gently during pregnancy.  It is easier to leak urine during pregnancy. Tightening up and strengthening the pelvic muscles will help with this problem. Practice stopping your urination while you are going to the  bathroom. These are the same muscles you need to strengthen. It is also the muscles you would use as if you were trying to stop from passing gas. You can practice tightening these muscles up 10 times a set and repeating this about 3 times per day. Once you know what muscles to tighten up, do not perform these exercises during urination. It is more likely to contribute to an infection by backing up the urine.  Ask for help if you have financial, counseling, or nutritional needs during pregnancy. Your caregiver will be able to offer counseling for these needs as well as refer you for other special needs.  Your skin may become oily. If so, wash your face with mild soap, use non-greasy moisturizer and oil or cream based makeup. MEDICINES AND DRUG USE IN PREGNANCY  Take prenatal vitamins as directed. The vitamin should contain 1 milligram of folic acid. Keep all vitamins out of reach of children. Only a couple vitamins or tablets containing iron may be fatal to a baby or young child when ingested.  Avoid use of all medicines, including herbs, over-the-counter medicines, not prescribed or suggested by your caregiver. Only take over-the-counter or prescription medicines for pain, discomfort, or fever as directed by your caregiver. Do not use aspirin.  Let your caregiver also know about herbs you may be using.  Alcohol is related to a number of birth defects. This includes fetal alcohol syndrome. All alcohol, in any form, should be avoided completely. Smoking will cause low birth rate and premature babies.  Street or illegal drugs are very harmful to the baby. They are absolutely forbidden. A baby born to an addicted mother will be addicted at birth. The baby will go through the same withdrawal an adult does. SEEK MEDICAL CARE IF:  You have any concerns or worries during your pregnancy. It is better to call with your questions if you feel they cannot wait, rather than worry about them. SEEK IMMEDIATE  MEDICAL CARE IF:   An unexplained oral temperature above 102 F (38.9 C) develops, or as your caregiver suggests.  You have leaking of fluid from the vagina (birth canal). If leaking membranes are suspected, take your temperature and tell your caregiver of this when you call.  There is vaginal spotting, bleeding, or passing clots. Tell your caregiver of the amount and how many pads are used. Light spotting in pregnancy is common, especially following intercourse.  You develop a bad smelling vaginal discharge with a change in the color from clear to white.  You continue to feel   sick to your stomach (nauseated) and have no relief from remedies suggested. You vomit blood or coffee ground-like materials.  You lose more than 2 pounds of weight or gain more than 2 pounds of weight over 1 week, or as suggested by your caregiver.  You notice swelling of your face, hands, feet, or legs.  You get exposed to German measles and have never had them.  You are exposed to fifth disease or chickenpox.  You develop belly (abdominal) pain. Round ligament discomfort is a common non-cancerous (benign) cause of abdominal pain in pregnancy. Your caregiver still must evaluate you.  You develop a bad headache that does not go away.  You develop fever, diarrhea, pain with urination, or shortness of breath.  You develop visual problems, blurry, or double vision.  You fall or are in a car accident or any kind of trauma.  There is mental or physical violence at home. Document Released: 02/06/2001 Document Revised: 11/07/2011 Document Reviewed: 08/11/2008 [employer] Patient Information 2014 [employer].  Breastfeeding A change in hormones during your pregnancy causes growth of your breast tissue and an increase in number and size of milk ducts. The hormone prolactin allows proteins, sugars, and fats from your blood supply to make breast milk in your milk-producing glands. The hormone progesterone prevents  breast milk from being released before the birth of your baby. After the birth of your baby, your progesterone level decreases allowing breast milk to be released. Thoughts of your baby, as well as his or her sucking or crying, can stimulate the release of milk from the milk-producing glands. Deciding to breastfeed (nurse) is one of the best choices you can make for you and your baby. The information that follows gives a brief review of the benefits, as well as other important skills to know about breastfeeding. BENEFITS OF BREASTFEEDING For your baby  The first milk (colostrum) helps your baby's digestive system function better.   There are antibodies in your milk that help your baby fight off infections.   Your baby has a lower incidence of asthma, allergies, and sudden infant death syndrome (SIDS).   The nutrients in breast milk are better for your baby than infant formulas.  Breast milk improves your baby's brain development.   Your baby will have less gas, colic, and constipation.  Your baby is less likely to develop other conditions, such as childhood obesity, asthma, or diabetes mellitus. For you  Breastfeeding helps develop a very special bond between you and your baby.   Breastfeeding is convenient, always available at the correct temperature, and costs nothing.   Breastfeeding helps to burn calories and helps you lose the weight gained during pregnancy.   Breastfeeding makes your uterus contract back down to normal size faster and slows bleeding following delivery.   Breastfeeding mothers have a lower risk of developing osteoporosis or breast or ovarian cancer later in life.  BREASTFEEDING FREQUENCY  A healthy, full-term baby may breastfeed as often as every hour or space his or her feedings to every 3 hours. Breastfeeding frequency will vary from baby to baby.   Newborns should be fed no less than every 2 3 hours during the day and every 4 5 hours during the  night. You should breastfeed a minimum of 8 feedings in a 24 hour period.  Awaken your baby to breastfeed if it has been 3 4 hours since the last feeding.  Breastfeed when you feel the need to reduce the fullness of your breasts or when   your newborn shows signs of hunger. Signs that your baby may be hungry include:  Increased alertness or activity.  Stretching.  Movement of the head from side to side.  Movement of the head and opening of the mouth when the corner of the mouth or cheek is stroked (rooting).  Increased sucking sounds, smacking lips, cooing, sighing, or squeaking.  Hand-to-mouth movements.  Increased sucking of fingers or hands.  Fussing.  Intermittent crying.  Signs of extreme hunger will require calming and consoling before you try to feed your baby. Signs of extreme hunger may include:  Restlessness.  A loud, strong cry.  Screaming.  Frequent feeding will help you make more milk and will help prevent problems, such as sore nipples and engorgement of the breasts.  BREASTFEEDING   Whether lying down or sitting, be sure that the baby's abdomen is facing your abdomen.   Support your breast with 4 fingers under your breast and your thumb above your nipple. Make sure your fingers are well away from your nipple and your baby's mouth.   Stroke your baby's lips gently with your finger or nipple.   When your baby's mouth is open wide enough, place all of your nipple and as much of the colored area around your nipple (areola) as possible into your baby's mouth.  More areola should be visible above his or her upper lip than below his or her lower lip.  Your baby's tongue should be between his or her lower gum and your breast.  Ensure that your baby's mouth is correctly positioned around the nipple (latched). Your baby's lips should create a seal on your breast.  Signs that your baby has effectively latched onto your nipple include:  Tugging or sucking  without pain.  Swallowing heard between sucks.  Absent click or smacking sound.  Muscle movement above and in front of his or her ears with sucking.  Your baby must suck about 2 3 minutes in order to get your milk. Allow your baby to feed on each breast as long as he or she wants. Nurse your baby until he or she unlatches or falls asleep at the first breast, then offer the second breast.  Signs that your baby is full and satisfied include:  A gradual decrease in the number of sucks or complete cessation of sucking.  Falling asleep.  Extension or relaxation of his or her body.  Retention of a small amount of milk in his or her mouth.  Letting go of your breast by himself or herself.  Signs of effective breastfeeding in you include:  Breasts that have increased firmness, weight, and size prior to feeding.  Breasts that are softer after nursing.  Increased milk volume, as well as a change in milk consistency and color by the 5th day of breastfeeding.  Breast fullness relieved by breastfeeding.  Nipples are not sore, cracked, or bleeding.  If needed, break the suction by putting your finger into the corner of your baby's mouth and sliding your finger between his or her gums. Then, remove your breast from his or her mouth.  It is common for babies to spit up a small amount after a feeding.  Babies often swallow air during feeding. This can make babies fussy. Burping your baby between breasts can help with this.  Vitamin D supplements are recommended for babies who get only breast milk.  Avoid using a pacifier during your baby's first 4 6 weeks.  Avoid supplemental feedings of water, formula, or   juice in place of breastfeeding. Breast milk is all the food your baby needs. It is not necessary for your baby to have water or formula. Your breasts will make more milk if supplemental feedings are avoided during the early weeks. HOW TO TELL WHETHER YOUR BABY IS GETTING ENOUGH BREAST  MILK Wondering whether or not your baby is getting enough milk is a common concern among mothers. You can be assured that your baby is getting enough milk if:   Your baby is actively sucking and you hear swallowing.   Your baby seems relaxed and satisfied after a feeding.   Your baby nurses at least 8 12 times in a 24 hour time period.  During the first 3 5 days of age:  Your baby is wetting at least 3 5 diapers in a 24 hour period. The urine should be clear and pale yellow.  Your baby is having at least 3 4 stools in a 24 hour period. The stool should be soft and yellow.  At 5 7 days of age, your baby is having at least 3 6 stools in a 24 hour period. The stool should be seedy and yellow by 5 days of age.  Your baby has a weight loss less than 7 10% during the first 3 days of age.  Your baby does not lose weight after 3 7 days of age.  Your baby gains 4 7 ounces each week after he or she is 4 days of age.  Your baby gains weight by 5 days of age and is back to birth weight within 2 weeks. ENGORGEMENT In the first week after your baby is born, you may experience extremely full breasts (engorgement). When engorged, your breasts may feel heavy, warm, or tender to the touch. Engorgement peaks within 24 48 hours after delivery of your baby.  Engorgement may be reduced by:  Continuing to breastfeed.  Increasing the frequency of breastfeeding.  Taking warm showers or applying warm, moist heat to your breasts just before each feeding. This increases circulation and helps the milk flow.   Gently massaging your breast before and during the feedings. With your fingertips, massage from your chest wall towards your nipple in a circular motion.   Ensuring that your baby empties at least one breast at every feeding. It also helps to start the next feeding on the opposite breast.   Expressing breast milk by hand or by using a breast pump to empty the breasts if your baby is sleepy, or  not nursing well. You may also want to express milk if you are returning to work oryou feel you are getting engorged.  Ensuring your baby is latched on and positioned properly while breastfeeding. If you follow these suggestions, your engorgement should improve in 24 48 hours. If you are still experiencing difficulty, call your lactation consultant or caregiver.  CARING FOR YOURSELF Take care of your breasts.  Bathe or shower daily.   Avoid using soap on your nipples.   Wear a supportive bra. Avoid wearing underwire style bras.  Air dry your nipples for a 3 4minutes after each feeding.   Use only cotton bra pads to absorb breast milk leakage. Leaking of breast milk between feedings is normal.   Use only pure lanolin on your nipples after nursing. You do not need to wash it off before feeding your baby again. Another option is to express a few drops of breast milk and gently massage that milk into your nipples.  Continue   breast self-awareness checks. Take care of yourself.  Eat healthy foods. Alternate 3 meals with 3 snacks.  Avoid foods that you notice affect your baby in a bad way.  Drink milk, fruit juice, and water to satisfy your thirst (about 8 glasses a day).   Rest often, relax, and take your prenatal vitamins to prevent fatigue, stress, and anemia.  Avoid chewing and smoking tobacco.  Avoid alcohol and drug use.  Take over-the-counter and prescribed medicine only as directed by your caregiver or pharmacist. You should always check with your caregiver or pharmacist before taking any new medicine, vitamin, or herbal supplement.  Know that pregnancy is possible while breastfeeding. If desired, talk to your caregiver about family planning and safe birth control methods that may be used while breastfeeding. SEEK MEDICAL CARE IF:   You feel like you want to stop breastfeeding or have become frustrated with breastfeeding.  You have painful breasts or nipples.  Your  nipples are cracked or bleeding.  Your breasts are red, tender, or warm.  You have a swollen area on either breast.  You have a fever or chills.  You have nausea or vomiting.  You have drainage from your nipples.  Your breasts do not become full before feedings by the 5th day after delivery.  You feel sad and depressed.  Your baby is too sleepy to eat well.  Your baby is having trouble sleeping.   Your baby is wetting less than 3 diapers in a 24 hour period.  Your baby has less than 3 stools in a 24 hour period.  Your baby's skin or the white part of his or her eyes becomes more yellow.   Your baby is not gaining weight by 5 days of age. MAKE SURE YOU:   Understand these instructions.  Will watch your condition.  Will get help right away if you are not doing well or get worse. Document Released: 02/12/2005 Document Revised: 11/07/2011 Document Reviewed: 09/19/2011 [employer] Patient Information 2014 [employer].  

## 2012-07-22 ENCOUNTER — Telehealth (HOSPITAL_COMMUNITY): Payer: Self-pay | Admitting: MS"

## 2012-07-22 NOTE — Telephone Encounter (Signed)
Called Tanya Maxwell to discuss her cell free fetal DNA test results.  Mrs. Tanya Maxwell had [facility] testing through [facility].  Testing was offered because of advanced maternal age.   The patient was identified by name and DOB.  We reviewed that these are within normal limits, showing a less than 1 in 10,000 risk for trisomies 21, 18 and 13.  This testing identifies > 99% of pregnancies with trisomy 21, >98% of pregnancies with trisomy 53, and approximately 80% of pregnancies with trisomy 73.  X and Y analysis was not performed for this sample. The false positive rate is <0.1% for all conditions.  She understands that this testing does not identify all genetic conditions.  All questions were answered to her satisfaction, she was encouraged to call with additional questions or concerns.  Quinn Plowman, MS Certified Genetic Counselor 07/22/2012 2:02 PM

## 2012-08-13 ENCOUNTER — Ambulatory Visit (INDEPENDENT_AMBULATORY_CARE_PROVIDER_SITE_OTHER): Payer: Non-veteran care | Admitting: Obstetrics & Gynecology

## 2012-08-13 DIAGNOSIS — O09529 Supervision of elderly multigravida, unspecified trimester: Secondary | ICD-10-CM

## 2012-08-13 DIAGNOSIS — Z3402 Encounter for supervision of normal first pregnancy, second trimester: Secondary | ICD-10-CM

## 2012-08-13 DIAGNOSIS — Z3400 Encounter for supervision of normal first pregnancy, unspecified trimester: Secondary | ICD-10-CM

## 2012-08-13 NOTE — Progress Notes (Signed)
Anatomy scan next week. No other complaints or concerns.  Routine obstetric precautions reviewed.

## 2012-08-13 NOTE — Patient Instructions (Signed)
Return to clinic for any obstetric concerns or go to [facility] for evaluation  

## 2012-08-13 NOTE — Progress Notes (Signed)
P - [phone] 

## 2012-08-19 IMAGING — US US OB COMP LESS 14 WK
1 series · 13 of 28 positions shown · non-contrast
Comparison: none

[Series 1: us ob comp less 14 wks · 37 acquisitions, 13 frames shown]
[im 2/37]
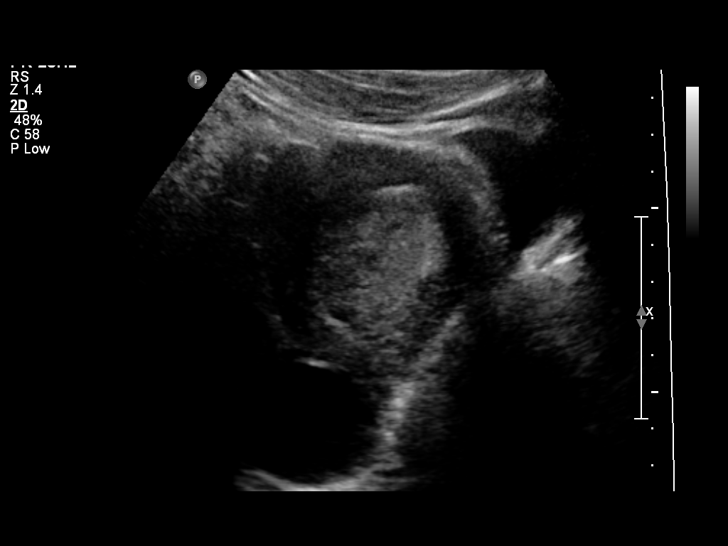
[im 5/37]
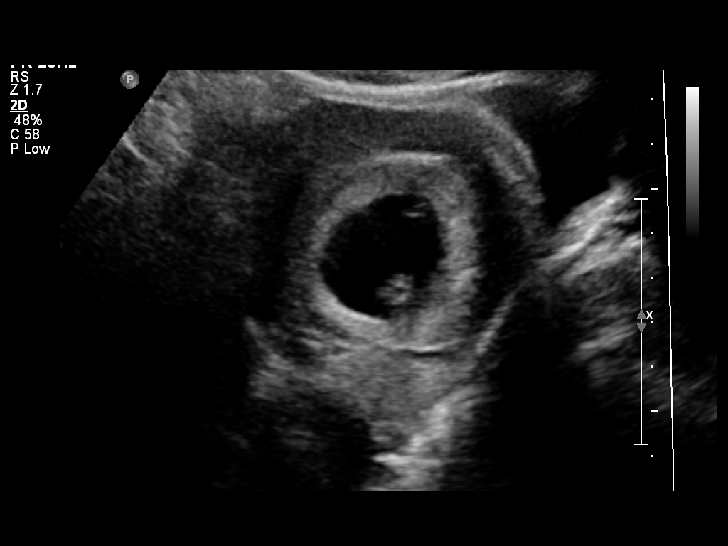
[im 7/37]
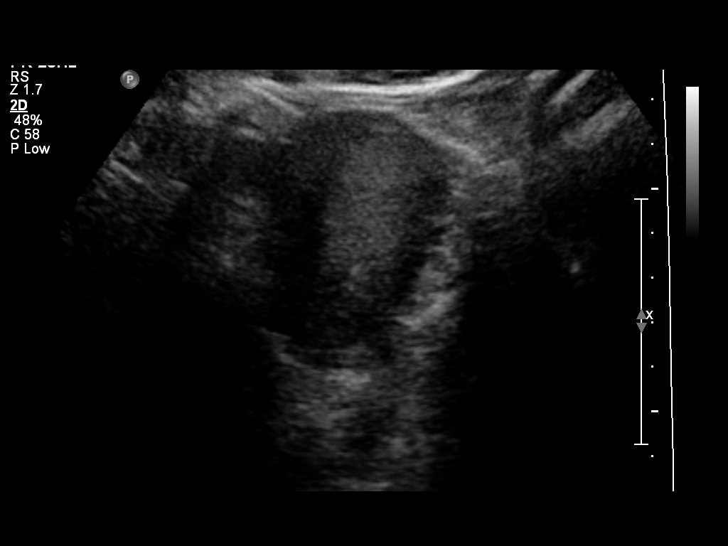
[im 10/37]
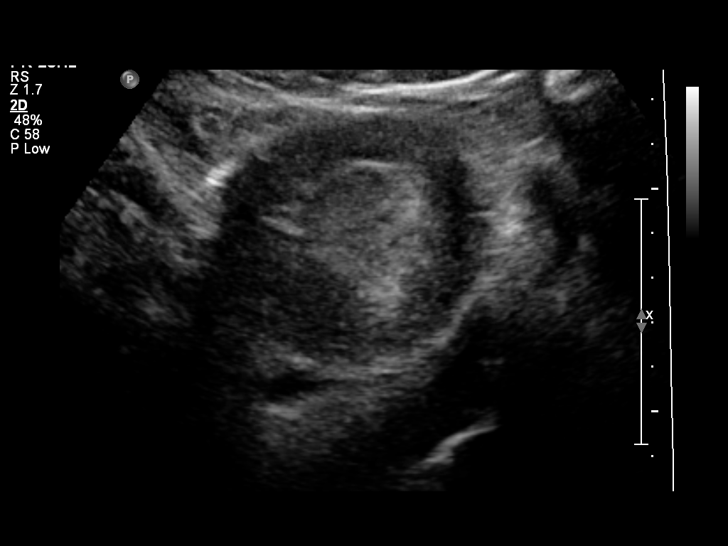
[im 13/37]
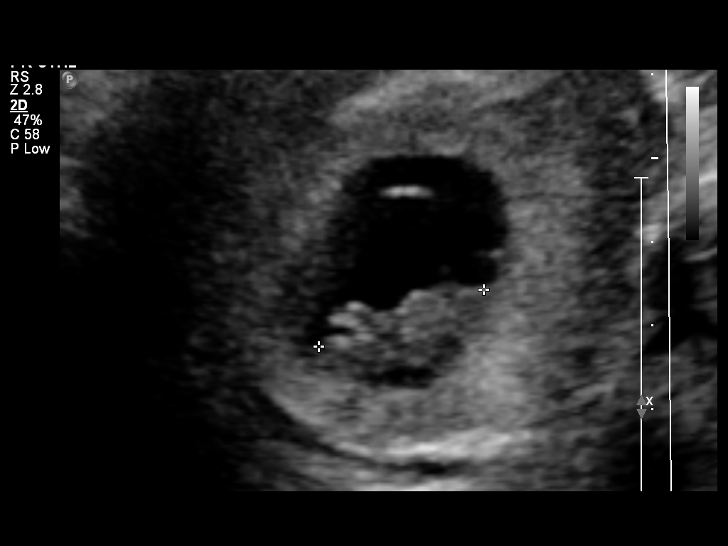
[im 15/37]
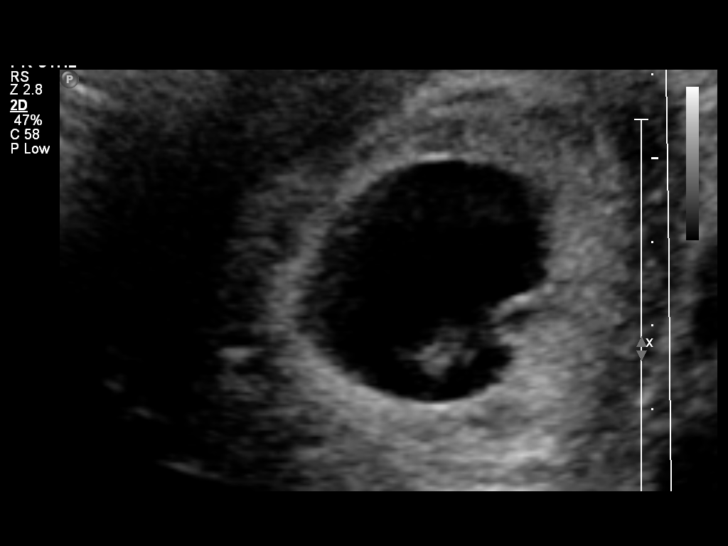
[im 19/37]
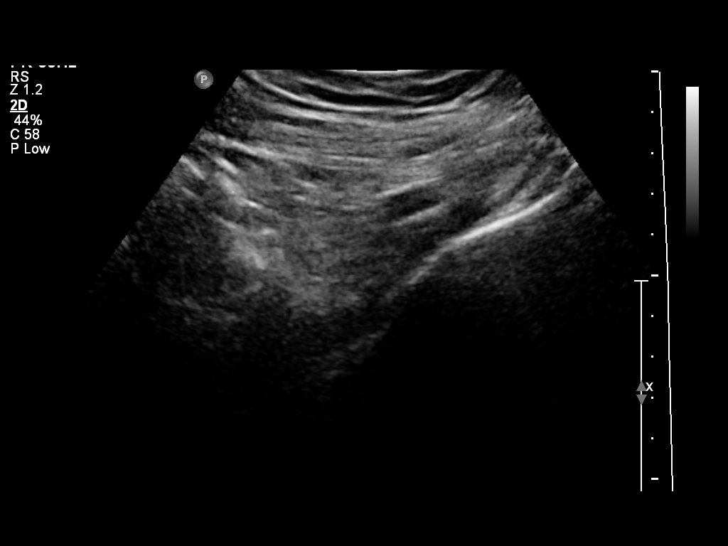
[im 22/37]
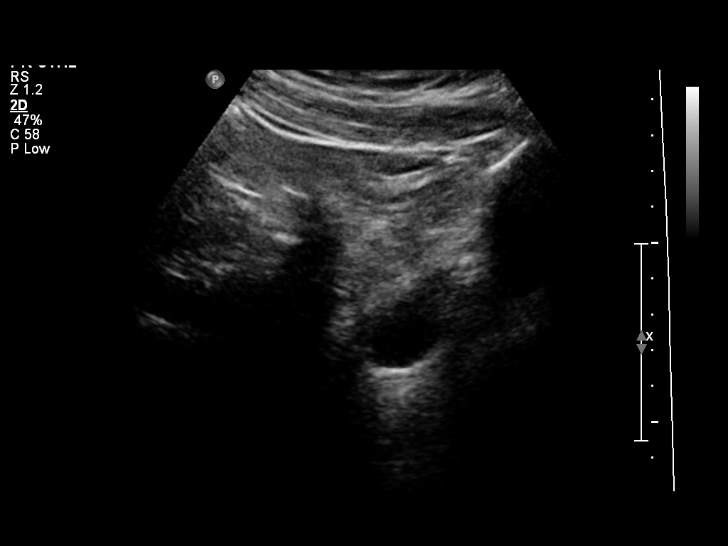
[im 25/37]
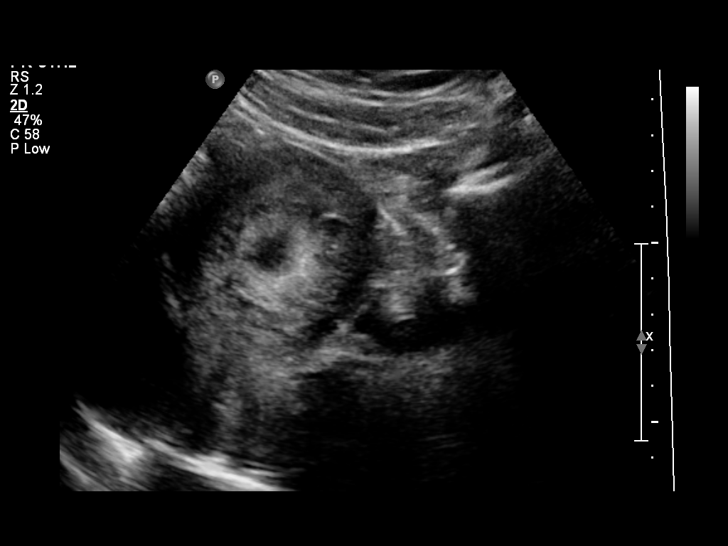
[im 27/37]
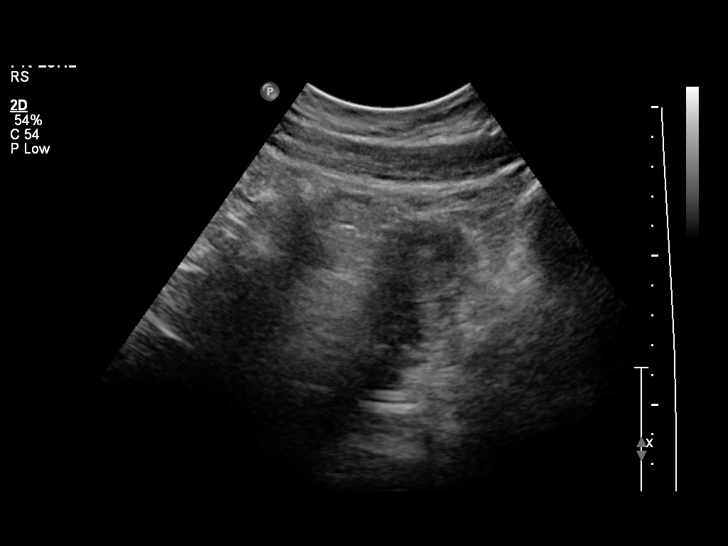
[im 30/37]
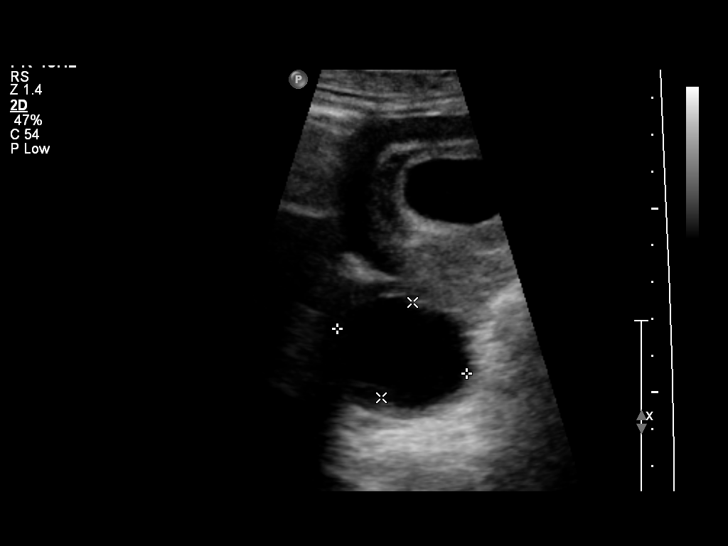
[im 33/37]
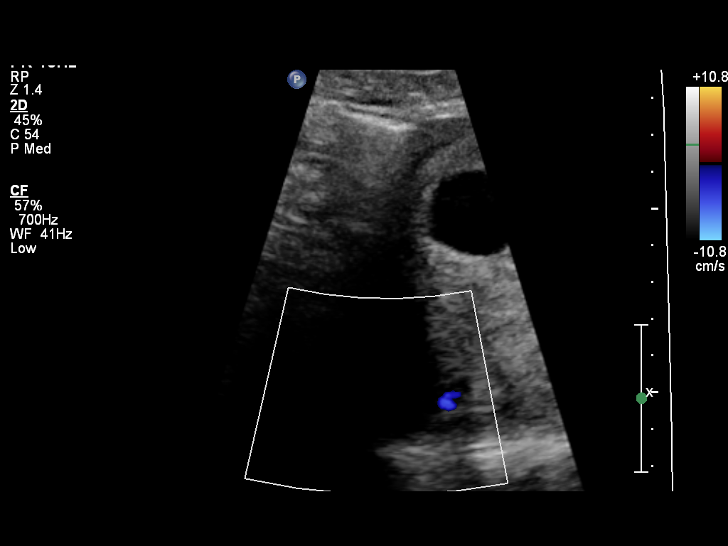
[im 35/37]
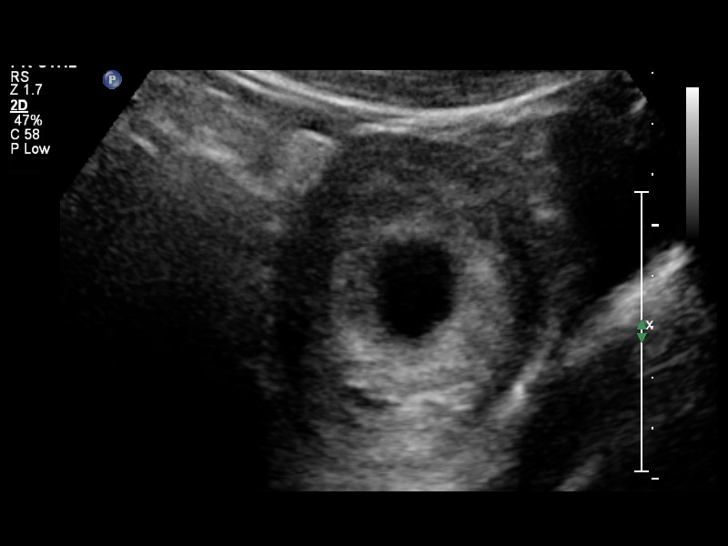

[13 of 28 positions shown; findings below may reference images not displayed]

OBSTETRICS REPORT
                      (Signed Final 12/07/2010 [DATE])

 Name:     YAMIL HF              Visit Date: 12/07/2010 [DATE]

 Order#:         [id number]
Procedures

 US OB COMP LESS 14 WKS                                76801.0
Indications

 Assess fetal well being
 Uncertain LMP;  Establish Gestational [AGE]
Fetal Evaluation

 Preg. Location:    Intrauterine
 Gest. Sac:         Intrauterine, small
                    subchorionic bleed
 [place]olk Sac:          Visualized
 Fetal Pole:        Visualized
 Fetal Heart Rate:  176                          bpm
 Cardiac Activity:  Observed
Biometry

 CRL:     19.9  mm     G. Age:  8w 3d                  EDD:    07/16/11
Gestational Age

 LMP:           8w 3d         Date:  10/09/10                 EDD:   07/16/11
 Best:          8w 3d      Det. By:  LMP  (10/09/10)          EDD:   07/16/11
Cervix Uterus Adnexa

 Cervix:       Closed.
 Uterus:       Small old subchorionic hemorrhage.
 Cul De Sac:   No free fluid seen.
 Left Ovary:    Within normal limits.
 Right Ovary:   Simple cyst, measuring X.HEN.HEN.F cm.
 Adnexa:     No abnormality visualized.
Impression

 There is a single living intrauterine pregancy demonstrating
 an EGA by CRL of  8w 3d. This correlates well with expected
 EGA by LMP of 8w 3d.

 Simple right ovarian cyst, likely a corpus luteum. This can
 reassessed at the time of anatomy follow up  .

## 2012-08-21 ENCOUNTER — Ambulatory Visit (HOSPITAL_COMMUNITY)
Admission: RE | Admit: 2012-08-21 | Discharge: 2012-08-21 | Disposition: A | Discharge status: Home / Self Care | Payer: Non-veteran care | Source: Ambulatory Visit | Attending: Family Medicine | Admitting: Family Medicine

## 2012-08-21 DIAGNOSIS — Z363 Encounter for antenatal screening for malformations: Secondary | ICD-10-CM | POA: Insufficient documentation

## 2012-08-21 DIAGNOSIS — O09529 Supervision of elderly multigravida, unspecified trimester: Secondary | ICD-10-CM | POA: Insufficient documentation

## 2012-08-21 DIAGNOSIS — Z1389 Encounter for screening for other disorder: Secondary | ICD-10-CM | POA: Diagnosis not present

## 2012-08-21 DIAGNOSIS — O358XX0 Maternal care for other (suspected) fetal abnormality and damage, not applicable or unspecified: Secondary | ICD-10-CM | POA: Diagnosis present

## 2012-08-21 NOTE — Progress Notes (Signed)
Tanya Maxwell  was seen today for an ultrasound appointment.  See full report in [facility].  Impression: Single IUP at 19 1/7 weeks Bilateral renal pylectasis without calyceal dilation (Rt 4.2 mm; Lt 4.1 mm) Fetal anatomy otherwise within normal limits.  Ductal arch not well visualized. No other markers associated with aneuploidy noted Normal amniotic fluid volume  NIPS (cell free fetal DNA) - low risk for aneuploidy  Recommendations: Recommend follow-up ultrasound examination at approx 30 weeks to reevaluate the fetal kidneys.  Alpha Gula, MD

## 2012-09-09 ENCOUNTER — Ambulatory Visit (INDEPENDENT_AMBULATORY_CARE_PROVIDER_SITE_OTHER): Payer: Non-veteran care | Admitting: Obstetrics & Gynecology

## 2012-09-09 VITALS — BP 113/59 | Wt 148.0 lb

## 2012-09-09 DIAGNOSIS — Z3402 Encounter for supervision of normal first pregnancy, second trimester: Secondary | ICD-10-CM

## 2012-09-09 DIAGNOSIS — Z3400 Encounter for supervision of normal first pregnancy, unspecified trimester: Secondary | ICD-10-CM

## 2012-09-09 NOTE — Progress Notes (Signed)
Routine visit. Good FM. No problems.  

## 2012-09-30 ENCOUNTER — Ambulatory Visit (INDEPENDENT_AMBULATORY_CARE_PROVIDER_SITE_OTHER): Payer: Non-veteran care | Admitting: Family Medicine

## 2012-09-30 VITALS — BP 99/60 | Wt 151.0 lb

## 2012-09-30 DIAGNOSIS — Z3402 Encounter for supervision of normal first pregnancy, second trimester: Secondary | ICD-10-CM

## 2012-09-30 DIAGNOSIS — Z3400 Encounter for supervision of normal first pregnancy, unspecified trimester: Secondary | ICD-10-CM

## 2012-09-30 NOTE — Progress Notes (Signed)
28 wk labs next visit

## 2012-09-30 NOTE — Patient Instructions (Signed)
Pregnancy - Second Trimester The second trimester of pregnancy (3 to 6 months) is a period of rapid growth for you and your baby. At the end of the sixth month, your baby is about 9 inches long and weighs 1 1/2 pounds. You will begin to feel the baby move between 18 and 20 weeks of the pregnancy. This is called quickening. Weight gain is faster. A clear fluid (colostrum) may leak out of your breasts. You may feel small contractions of the womb (uterus). This is known as false labor or Braxton-Hicks contractions. This is like a practice for labor when the baby is ready to be born. Usually, the problems with morning sickness have usually passed by the end of your first trimester. Some women develop small dark blotches (called cholasma, mask of pregnancy) on their face that usually goes away after the baby is born. Exposure to the sun makes the blotches worse. Acne may also develop in some pregnant women and pregnant women who have acne, may find that it goes away. PRENATAL EXAMS  Blood work may continue to be done during prenatal exams. These tests are done to check on your health and the probable health of your baby. Blood work is used to follow your blood levels (hemoglobin). Anemia (low hemoglobin) is common during pregnancy. Iron and vitamins are given to help prevent this. You will also be checked for diabetes between 24 and 28 weeks of the pregnancy. Some of the previous blood tests may be repeated.  The size of the uterus is measured during each visit. This is to make sure that the baby is continuing to grow properly according to the dates of the pregnancy.  Your blood pressure is checked every prenatal visit. This is to make sure you are not getting toxemia.  Your urine is checked to make sure you do not have an infection, diabetes or protein in the urine.  Your weight is checked often to make sure gains are happening at the suggested rate. This is to ensure that both you and your baby are  growing normally.  Sometimes, an ultrasound is performed to confirm the proper growth and development of the baby. This is a test which bounces harmless sound waves off the baby so your caregiver can more accurately determine due dates. Sometimes, a test is done on the amniotic fluid surrounding the baby. This test is called an amniocentesis. The amniotic fluid is obtained by sticking a needle into the belly (abdomen). This is done to check the chromosomes in instances where there is a concern about possible genetic problems with the baby. It is also sometimes done near the end of pregnancy if an early delivery is required. In this case, it is done to help make sure the baby's lungs are mature enough for the baby to live outside of the womb. CHANGES OCCURING IN THE SECOND TRIMESTER OF PREGNANCY Your body goes through many changes during pregnancy. They vary from person to person. Talk to your caregiver about changes you notice that you are concerned about.  During the second trimester, you will likely have an increase in your appetite. It is normal to have cravings for certain foods. This varies from person to person and pregnancy to pregnancy.  Your lower abdomen will begin to bulge.  You may have to urinate more often because the uterus and baby are pressing on your bladder. It is also common to get more bladder infections during pregnancy. You can help this by drinking lots of fluids   and emptying your bladder before and after intercourse.  You may begin to get stretch marks on your hips, abdomen, and breasts. These are normal changes in the body during pregnancy. There are no exercises or medicines to take that prevent this change.  You may begin to develop swollen and bulging veins (varicose veins) in your legs. Wearing support hose, elevating your feet for 15 minutes, 3 to 4 times a day and limiting salt in your diet helps lessen the problem.  Heartburn may develop as the uterus grows and  pushes up against the stomach. Antacids recommended by your caregiver helps with this problem. Also, eating smaller meals 4 to 5 times a day helps.  Constipation can be treated with a stool softener or adding bulk to your diet. Drinking lots of fluids, and eating vegetables, fruits, and whole grains are helpful.  Exercising is also helpful. If you have been very active up until your pregnancy, most of these activities can be continued during your pregnancy. If you have been less active, it is helpful to start an exercise program such as walking.  Hemorrhoids may develop at the end of the second trimester. Warm sitz baths and hemorrhoid cream recommended by your caregiver helps hemorrhoid problems.  Backaches may develop during this time of your pregnancy. Avoid heavy lifting, wear low heal shoes, and practice good posture to help with backache problems.  Some pregnant women develop tingling and numbness of their hand and fingers because of swelling and tightening of ligaments in the wrist (carpel tunnel syndrome). This goes away after the baby is born.  As your breasts enlarge, you may have to get a bigger bra. Get a comfortable, cotton, support bra. Do not get a nursing bra until the last month of the pregnancy if you will be nursing the baby.  You may get a dark line from your belly button to the pubic area called the linea nigra.  You may develop rosy cheeks because of increase blood flow to the face.  You may develop spider looking lines of the face, neck, arms, and chest. These go away after the baby is born. HOME CARE INSTRUCTIONS   It is extremely important to avoid all smoking, herbs, alcohol, and unprescribed drugs during your pregnancy. These chemicals affect the formation and growth of the baby. Avoid these chemicals throughout the pregnancy to ensure the delivery of a healthy infant.  Most of your home care instructions are the same as suggested for the first trimester of your  pregnancy. Keep your caregiver's appointments. Follow your caregiver's instructions regarding medicine use, exercise, and diet.  During pregnancy, you are providing food for you and your baby. Continue to eat regular, well-balanced meals. Choose foods such as meat, fish, milk and other low fat dairy products, vegetables, fruits, and whole-grain breads and cereals. Your caregiver will tell you of the ideal weight gain.  A physical sexual relationship may be continued up until near the end of pregnancy if there are no other problems. Problems could include early (premature) leaking of amniotic fluid from the membranes, vaginal bleeding, abdominal pain, or other medical or pregnancy problems.  Exercise regularly if there are no restrictions. Check with your caregiver if you are unsure of the safety of some of your exercises. The greatest weight gain will occur in the last 2 trimesters of pregnancy. Exercise will help you:  Control your weight.  Get you in shape for labor and delivery.  Lose weight after you have the baby.  Wear   a good support or jogging bra for breast tenderness during pregnancy. This may help if worn during sleep. Pads or tissues may be used in the bra if you are leaking colostrum.  Do not use hot tubs, steam rooms or saunas throughout the pregnancy.  Wear your seat belt at all times when driving. This protects you and your baby if you are in an accident.  Avoid raw meat, uncooked cheese, cat litter boxes, and soil used by cats. These carry germs that can cause birth defects in the baby.  The second trimester is also a good time to visit your dentist for your dental health if this has not been done yet. Getting your teeth cleaned is okay. Use a soft toothbrush. Brush gently during pregnancy.  It is easier to leak urine during pregnancy. Tightening up and strengthening the pelvic muscles will help with this problem. Practice stopping your urination while you are going to the  bathroom. These are the same muscles you need to strengthen. It is also the muscles you would use as if you were trying to stop from passing gas. You can practice tightening these muscles up 10 times a set and repeating this about 3 times per day. Once you know what muscles to tighten up, do not perform these exercises during urination. It is more likely to contribute to an infection by backing up the urine.  Ask for help if you have financial, counseling, or nutritional needs during pregnancy. Your caregiver will be able to offer counseling for these needs as well as refer you for other special needs.  Your skin may become oily. If so, wash your face with mild soap, use non-greasy moisturizer and oil or cream based makeup. MEDICINES AND DRUG USE IN PREGNANCY  Take prenatal vitamins as directed. The vitamin should contain 1 milligram of folic acid. Keep all vitamins out of reach of children. Only a couple vitamins or tablets containing iron may be fatal to a baby or young child when ingested.  Avoid use of all medicines, including herbs, over-the-counter medicines, not prescribed or suggested by your caregiver. Only take over-the-counter or prescription medicines for pain, discomfort, or fever as directed by your caregiver. Do not use aspirin.  Let your caregiver also know about herbs you may be using.  Alcohol is related to a number of birth defects. This includes fetal alcohol syndrome. All alcohol, in any form, should be avoided completely. Smoking will cause low birth rate and premature babies.  Street or illegal drugs are very harmful to the baby. They are absolutely forbidden. A baby born to an addicted mother will be addicted at birth. The baby will go through the same withdrawal an adult does. SEEK MEDICAL CARE IF:  You have any concerns or worries during your pregnancy. It is better to call with your questions if you feel they cannot wait, rather than worry about them. SEEK IMMEDIATE  MEDICAL CARE IF:   An unexplained oral temperature above 102 F (38.9 C) develops, or as your caregiver suggests.  You have leaking of fluid from the vagina (birth canal). If leaking membranes are suspected, take your temperature and tell your caregiver of this when you call.  There is vaginal spotting, bleeding, or passing clots. Tell your caregiver of the amount and how many pads are used. Light spotting in pregnancy is common, especially following intercourse.  You develop a bad smelling vaginal discharge with a change in the color from clear to white.  You continue to feel   sick to your stomach (nauseated) and have no relief from remedies suggested. You vomit blood or coffee ground-like materials.  You lose more than 2 pounds of weight or gain more than 2 pounds of weight over 1 week, or as suggested by your caregiver.  You notice swelling of your face, hands, feet, or legs.  You get exposed to German measles and have never had them.  You are exposed to fifth disease or chickenpox.  You develop belly (abdominal) pain. Round ligament discomfort is a common non-cancerous (benign) cause of abdominal pain in pregnancy. Your caregiver still must evaluate you.  You develop a bad headache that does not go away.  You develop fever, diarrhea, pain with urination, or shortness of breath.  You develop visual problems, blurry, or double vision.  You fall or are in a car accident or any kind of trauma.  There is mental or physical violence at home. Document Released: 02/06/2001 Document Revised: 11/07/2011 Document Reviewed: 08/11/2008 [employer] Patient Information 2014 [employer].  Breastfeeding A change in hormones during your pregnancy causes growth of your breast tissue and an increase in number and size of milk ducts. The hormone prolactin allows proteins, sugars, and fats from your blood supply to make breast milk in your milk-producing glands. The hormone progesterone prevents  breast milk from being released before the birth of your baby. After the birth of your baby, your progesterone level decreases allowing breast milk to be released. Thoughts of your baby, as well as his or her sucking or crying, can stimulate the release of milk from the milk-producing glands. Deciding to breastfeed (nurse) is one of the best choices you can make for you and your baby. The information that follows gives a brief review of the benefits, as well as other important skills to know about breastfeeding. BENEFITS OF BREASTFEEDING For your baby  The first milk (colostrum) helps your baby's digestive system function better.   There are antibodies in your milk that help your baby fight off infections.   Your baby has a lower incidence of asthma, allergies, and sudden infant death syndrome (SIDS).   The nutrients in breast milk are better for your baby than infant formulas.  Breast milk improves your baby's brain development.   Your baby will have less gas, colic, and constipation.  Your baby is less likely to develop other conditions, such as childhood obesity, asthma, or diabetes mellitus. For you  Breastfeeding helps develop a very special bond between you and your baby.   Breastfeeding is convenient, always available at the correct temperature, and costs nothing.   Breastfeeding helps to burn calories and helps you lose the weight gained during pregnancy.   Breastfeeding makes your uterus contract back down to normal size faster and slows bleeding following delivery.   Breastfeeding mothers have a lower risk of developing osteoporosis or breast or ovarian cancer later in life.  BREASTFEEDING FREQUENCY  A healthy, full-term baby may breastfeed as often as every hour or space his or her feedings to every 3 hours. Breastfeeding frequency will vary from baby to baby.   Newborns should be fed no less than every 2 3 hours during the day and every 4 5 hours during the  night. You should breastfeed a minimum of 8 feedings in a 24 hour period.  Awaken your baby to breastfeed if it has been 3 4 hours since the last feeding.  Breastfeed when you feel the need to reduce the fullness of your breasts or when   your newborn shows signs of hunger. Signs that your baby may be hungry include:  Increased alertness or activity.  Stretching.  Movement of the head from side to side.  Movement of the head and opening of the mouth when the corner of the mouth or cheek is stroked (rooting).  Increased sucking sounds, smacking lips, cooing, sighing, or squeaking.  Hand-to-mouth movements.  Increased sucking of fingers or hands.  Fussing.  Intermittent crying.  Signs of extreme hunger will require calming and consoling before you try to feed your baby. Signs of extreme hunger may include:  Restlessness.  A loud, strong cry.  Screaming.  Frequent feeding will help you make more milk and will help prevent problems, such as sore nipples and engorgement of the breasts.  BREASTFEEDING   Whether lying down or sitting, be sure that the baby's abdomen is facing your abdomen.   Support your breast with 4 fingers under your breast and your thumb above your nipple. Make sure your fingers are well away from your nipple and your baby's mouth.   Stroke your baby's lips gently with your finger or nipple.   When your baby's mouth is open wide enough, place all of your nipple and as much of the colored area around your nipple (areola) as possible into your baby's mouth.  More areola should be visible above his or her upper lip than below his or her lower lip.  Your baby's tongue should be between his or her lower gum and your breast.  Ensure that your baby's mouth is correctly positioned around the nipple (latched). Your baby's lips should create a seal on your breast.  Signs that your baby has effectively latched onto your nipple include:  Tugging or sucking  without pain.  Swallowing heard between sucks.  Absent click or smacking sound.  Muscle movement above and in front of his or her ears with sucking.  Your baby must suck about 2 3 minutes in order to get your milk. Allow your baby to feed on each breast as long as he or she wants. Nurse your baby until he or she unlatches or falls asleep at the first breast, then offer the second breast.  Signs that your baby is full and satisfied include:  A gradual decrease in the number of sucks or complete cessation of sucking.  Falling asleep.  Extension or relaxation of his or her body.  Retention of a small amount of milk in his or her mouth.  Letting go of your breast by himself or herself.  Signs of effective breastfeeding in you include:  Breasts that have increased firmness, weight, and size prior to feeding.  Breasts that are softer after nursing.  Increased milk volume, as well as a change in milk consistency and color by the 5th day of breastfeeding.  Breast fullness relieved by breastfeeding.  Nipples are not sore, cracked, or bleeding.  If needed, break the suction by putting your finger into the corner of your baby's mouth and sliding your finger between his or her gums. Then, remove your breast from his or her mouth.  It is common for babies to spit up a small amount after a feeding.  Babies often swallow air during feeding. This can make babies fussy. Burping your baby between breasts can help with this.  Vitamin D supplements are recommended for babies who get only breast milk.  Avoid using a pacifier during your baby's first 4 6 weeks.  Avoid supplemental feedings of water, formula, or   juice in place of breastfeeding. Breast milk is all the food your baby needs. It is not necessary for your baby to have water or formula. Your breasts will make more milk if supplemental feedings are avoided during the early weeks. HOW TO TELL WHETHER YOUR BABY IS GETTING ENOUGH BREAST  MILK Wondering whether or not your baby is getting enough milk is a common concern among mothers. You can be assured that your baby is getting enough milk if:   Your baby is actively sucking and you hear swallowing.   Your baby seems relaxed and satisfied after a feeding.   Your baby nurses at least 8 12 times in a 24 hour time period.  During the first 3 5 days of age:  Your baby is wetting at least 3 5 diapers in a 24 hour period. The urine should be clear and pale yellow.  Your baby is having at least 3 4 stools in a 24 hour period. The stool should be soft and yellow.  At 5 7 days of age, your baby is having at least 3 6 stools in a 24 hour period. The stool should be seedy and yellow by 5 days of age.  Your baby has a weight loss less than 7 10% during the first 3 days of age.  Your baby does not lose weight after 3 7 days of age.  Your baby gains 4 7 ounces each week after he or she is 4 days of age.  Your baby gains weight by 5 days of age and is back to birth weight within 2 weeks. ENGORGEMENT In the first week after your baby is born, you may experience extremely full breasts (engorgement). When engorged, your breasts may feel heavy, warm, or tender to the touch. Engorgement peaks within 24 48 hours after delivery of your baby.  Engorgement may be reduced by:  Continuing to breastfeed.  Increasing the frequency of breastfeeding.  Taking warm showers or applying warm, moist heat to your breasts just before each feeding. This increases circulation and helps the milk flow.   Gently massaging your breast before and during the feedings. With your fingertips, massage from your chest wall towards your nipple in a circular motion.   Ensuring that your baby empties at least one breast at every feeding. It also helps to start the next feeding on the opposite breast.   Expressing breast milk by hand or by using a breast pump to empty the breasts if your baby is sleepy, or  not nursing well. You may also want to express milk if you are returning to work oryou feel you are getting engorged.  Ensuring your baby is latched on and positioned properly while breastfeeding. If you follow these suggestions, your engorgement should improve in 24 48 hours. If you are still experiencing difficulty, call your lactation consultant or caregiver.  CARING FOR YOURSELF Take care of your breasts.  Bathe or shower daily.   Avoid using soap on your nipples.   Wear a supportive bra. Avoid wearing underwire style bras.  Air dry your nipples for a 3 4minutes after each feeding.   Use only cotton bra pads to absorb breast milk leakage. Leaking of breast milk between feedings is normal.   Use only pure lanolin on your nipples after nursing. You do not need to wash it off before feeding your baby again. Another option is to express a few drops of breast milk and gently massage that milk into your nipples.  Continue   breast self-awareness checks. Take care of yourself.  Eat healthy foods. Alternate 3 meals with 3 snacks.  Avoid foods that you notice affect your baby in a bad way.  Drink milk, fruit juice, and water to satisfy your thirst (about 8 glasses a day).   Rest often, relax, and take your prenatal vitamins to prevent fatigue, stress, and anemia.  Avoid chewing and smoking tobacco.  Avoid alcohol and drug use.  Take over-the-counter and prescribed medicine only as directed by your caregiver or pharmacist. You should always check with your caregiver or pharmacist before taking any new medicine, vitamin, or herbal supplement.  Know that pregnancy is possible while breastfeeding. If desired, talk to your caregiver about family planning and safe birth control methods that may be used while breastfeeding. SEEK MEDICAL CARE IF:   You feel like you want to stop breastfeeding or have become frustrated with breastfeeding.  You have painful breasts or nipples.  Your  nipples are cracked or bleeding.  Your breasts are red, tender, or warm.  You have a swollen area on either breast.  You have a fever or chills.  You have nausea or vomiting.  You have drainage from your nipples.  Your breasts do not become full before feedings by the 5th day after delivery.  You feel sad and depressed.  Your baby is too sleepy to eat well.  Your baby is having trouble sleeping.   Your baby is wetting less than 3 diapers in a 24 hour period.  Your baby has less than 3 stools in a 24 hour period.  Your baby's skin or the white part of his or her eyes becomes more yellow.   Your baby is not gaining weight by 5 days of age. MAKE SURE YOU:   Understand these instructions.  Will watch your condition.  Will get help right away if you are not doing well or get worse. Document Released: 02/12/2005 Document Revised: 11/07/2011 Document Reviewed: 09/19/2011 [employer] Patient Information 2014 [employer].  

## 2012-09-30 NOTE — Assessment & Plan Note (Signed)
F/u for fetal renal pyelectasis is scheduled.

## 2012-09-30 NOTE — Progress Notes (Signed)
P - [phone] 

## 2012-10-15 ENCOUNTER — Encounter: Payer: Self-pay | Admitting: Obstetrics and Gynecology

## 2012-10-15 ENCOUNTER — Ambulatory Visit (INDEPENDENT_AMBULATORY_CARE_PROVIDER_SITE_OTHER): Payer: Non-veteran care | Admitting: Obstetrics and Gynecology

## 2012-10-15 VITALS — BP 97/61 | Wt 152.0 lb

## 2012-10-15 DIAGNOSIS — Z3480 Encounter for supervision of other normal pregnancy, unspecified trimester: Secondary | ICD-10-CM

## 2012-10-15 DIAGNOSIS — Z3482 Encounter for supervision of other normal pregnancy, second trimester: Secondary | ICD-10-CM

## 2012-10-15 DIAGNOSIS — O09529 Supervision of elderly multigravida, unspecified trimester: Secondary | ICD-10-CM

## 2012-10-15 DIAGNOSIS — O9989 Other specified diseases and conditions complicating pregnancy, childbirth and the puerperium: Secondary | ICD-10-CM

## 2012-10-15 DIAGNOSIS — O26892 Other specified pregnancy related conditions, second trimester: Secondary | ICD-10-CM

## 2012-10-15 LAB — CBC
MCH: 29.7 pg (ref 26.0–34.0)
Platelets: 170 10*3/uL (ref 150–400)
RBC: 3.77 MIL/uL — ABNORMAL LOW (ref 3.87–5.11)
RDW: 13.8 % (ref 11.5–15.5)
WBC: 6.4 10*3/uL (ref 4.0–10.5)

## 2012-10-15 MED ORDER — TETANUS-DIPHTH-ACELL PERTUSSIS 5-2.5-18.5 LF-MCG/0.5 IM SUSP: 0.5000 mL | Freq: Once | INTRAMUSCULAR | Status: DC

## 2012-10-15 NOTE — Progress Notes (Signed)
P - [phone] 

## 2012-10-15 NOTE — Progress Notes (Signed)
Patient doing well without complaints. FM/PTL precautions reviewed. 1 hr GCT and labs today. Also discussed Tdap. Patient debating wether to receive it antepartum or postpartum. Patient does not want any contraception as she would like to conceive soon after completion of this pregnancy.

## 2012-10-16 LAB — HIV ANTIBODY (ROUTINE TESTING W REFLEX): HIV: NONREACTIVE

## 2012-10-17 ENCOUNTER — Encounter: Payer: Self-pay | Admitting: Obstetrics and Gynecology

## 2012-10-29 ENCOUNTER — Encounter: Payer: Non-veteran care | Admitting: Obstetrics & Gynecology

## 2012-10-30 ENCOUNTER — Ambulatory Visit (INDEPENDENT_AMBULATORY_CARE_PROVIDER_SITE_OTHER): Payer: Non-veteran care | Admitting: Obstetrics & Gynecology

## 2012-10-30 ENCOUNTER — Ambulatory Visit (HOSPITAL_COMMUNITY): Admission: RE | Admit: 2012-10-30 | Payer: Non-veteran care | Source: Ambulatory Visit

## 2012-10-30 VITALS — BP 114/65 | Wt 156.0 lb

## 2012-10-30 DIAGNOSIS — Z3483 Encounter for supervision of other normal pregnancy, third trimester: Secondary | ICD-10-CM

## 2012-10-30 DIAGNOSIS — O09529 Supervision of elderly multigravida, unspecified trimester: Secondary | ICD-10-CM

## 2012-10-30 DIAGNOSIS — Z3480 Encounter for supervision of other normal pregnancy, unspecified trimester: Secondary | ICD-10-CM

## 2012-10-30 NOTE — Progress Notes (Signed)
P-73 

## 2012-10-30 NOTE — Patient Instructions (Signed)
Return to clinic for any obstetric concerns or go to [facility] for evaluation  

## 2012-10-30 NOTE — Progress Notes (Signed)
Will receive Tdap postpartum.  No other complaints or concerns.  Fetal movement and labor precautions reviewed.

## 2012-11-07 ENCOUNTER — Other Ambulatory Visit (HOSPITAL_COMMUNITY): Payer: Self-pay | Admitting: Maternal and Fetal Medicine

## 2012-11-07 ENCOUNTER — Ambulatory Visit (HOSPITAL_COMMUNITY): Payer: Non-veteran care

## 2012-11-07 DIAGNOSIS — O358XX0 Maternal care for other (suspected) fetal abnormality and damage, not applicable or unspecified: Secondary | ICD-10-CM

## 2012-11-10 ENCOUNTER — Ambulatory Visit (HOSPITAL_COMMUNITY)
Admission: RE | Admit: 2012-11-10 | Discharge: 2012-11-10 | Disposition: A | Discharge status: Home / Self Care | Payer: Non-veteran care | Source: Ambulatory Visit | Attending: Obstetrics and Gynecology | Admitting: Obstetrics and Gynecology

## 2012-11-10 DIAGNOSIS — Z3689 Encounter for other specified antenatal screening: Secondary | ICD-10-CM | POA: Insufficient documentation

## 2012-11-10 DIAGNOSIS — O09529 Supervision of elderly multigravida, unspecified trimester: Secondary | ICD-10-CM | POA: Insufficient documentation

## 2012-11-10 DIAGNOSIS — O358XX0 Maternal care for other (suspected) fetal abnormality and damage, not applicable or unspecified: Secondary | ICD-10-CM

## 2012-11-13 ENCOUNTER — Encounter: Payer: Self-pay | Admitting: Obstetrics and Gynecology

## 2012-11-13 ENCOUNTER — Ambulatory Visit (INDEPENDENT_AMBULATORY_CARE_PROVIDER_SITE_OTHER): Payer: Non-veteran care | Admitting: Obstetrics and Gynecology

## 2012-11-13 DIAGNOSIS — Z3483 Encounter for supervision of other normal pregnancy, third trimester: Secondary | ICD-10-CM

## 2012-11-13 DIAGNOSIS — Z3480 Encounter for supervision of other normal pregnancy, unspecified trimester: Secondary | ICD-10-CM

## 2012-11-13 DIAGNOSIS — O09529 Supervision of elderly multigravida, unspecified trimester: Secondary | ICD-10-CM

## 2012-11-13 NOTE — Progress Notes (Signed)
Patient doing well without complaints. FM/PTL precautions reviewed. Ultrasound and 1hr GCT results reviewed.

## 2012-11-13 NOTE — Progress Notes (Signed)
P= 80 

## 2012-11-27 ENCOUNTER — Ambulatory Visit (INDEPENDENT_AMBULATORY_CARE_PROVIDER_SITE_OTHER): Payer: Non-veteran care | Admitting: Obstetrics & Gynecology

## 2012-11-27 ENCOUNTER — Encounter: Payer: Self-pay | Admitting: Obstetrics & Gynecology

## 2012-11-27 DIAGNOSIS — O09529 Supervision of elderly multigravida, unspecified trimester: Secondary | ICD-10-CM

## 2012-11-27 DIAGNOSIS — Z3480 Encounter for supervision of other normal pregnancy, unspecified trimester: Secondary | ICD-10-CM

## 2012-11-27 DIAGNOSIS — Z3483 Encounter for supervision of other normal pregnancy, third trimester: Secondary | ICD-10-CM

## 2012-11-27 NOTE — Progress Notes (Signed)
Counseled about flu vaccine, patient will think about it. No other complaints or concerns.  Fetal movement and labor precautions reviewed.

## 2012-11-27 NOTE — Patient Instructions (Signed)
Return to clinic for any obstetric concerns or go to [facility] for evaluation  

## 2012-11-27 NOTE — Progress Notes (Signed)
P = 77 

## 2012-12-11 ENCOUNTER — Ambulatory Visit (INDEPENDENT_AMBULATORY_CARE_PROVIDER_SITE_OTHER): Payer: Non-veteran care | Admitting: Obstetrics and Gynecology

## 2012-12-11 ENCOUNTER — Encounter: Payer: Self-pay | Admitting: Obstetrics and Gynecology

## 2012-12-11 VITALS — BP 110/69 | Wt 159.0 lb

## 2012-12-11 DIAGNOSIS — Z3483 Encounter for supervision of other normal pregnancy, third trimester: Secondary | ICD-10-CM

## 2012-12-11 DIAGNOSIS — O9989 Other specified diseases and conditions complicating pregnancy, childbirth and the puerperium: Secondary | ICD-10-CM

## 2012-12-11 DIAGNOSIS — O26893 Other specified pregnancy related conditions, third trimester: Secondary | ICD-10-CM

## 2012-12-11 DIAGNOSIS — O09529 Supervision of elderly multigravida, unspecified trimester: Secondary | ICD-10-CM

## 2012-12-11 DIAGNOSIS — Z3480 Encounter for supervision of other normal pregnancy, unspecified trimester: Secondary | ICD-10-CM

## 2012-12-11 NOTE — Progress Notes (Signed)
P=79 

## 2012-12-11 NOTE — Progress Notes (Signed)
Patient doing well without complaints. FM/PTL precautions reviewed. Cultures next visit. Patient not interested in postpartum contraception

## 2012-12-18 ENCOUNTER — Encounter: Payer: Self-pay | Admitting: Obstetrics and Gynecology

## 2012-12-18 ENCOUNTER — Ambulatory Visit (INDEPENDENT_AMBULATORY_CARE_PROVIDER_SITE_OTHER): Payer: Non-veteran care | Admitting: Obstetrics and Gynecology

## 2012-12-18 VITALS — BP 108/67 | Wt 160.0 lb

## 2012-12-18 DIAGNOSIS — Z3483 Encounter for supervision of other normal pregnancy, third trimester: Secondary | ICD-10-CM

## 2012-12-18 DIAGNOSIS — O09529 Supervision of elderly multigravida, unspecified trimester: Secondary | ICD-10-CM

## 2012-12-18 DIAGNOSIS — Z3480 Encounter for supervision of other normal pregnancy, unspecified trimester: Secondary | ICD-10-CM

## 2012-12-18 LAB — OB RESULTS CONSOLE GC/CHLAMYDIA: Chlamydia: NEGATIVE

## 2012-12-18 NOTE — Progress Notes (Signed)
P=79 

## 2012-12-18 NOTE — Progress Notes (Signed)
Patient doing well without complaints. FM/labor precautions reviewed. Cultures collected 

## 2012-12-19 LAB — GC/CHLAMYDIA PROBE AMP: CT Probe RNA: NEGATIVE

## 2012-12-20 ENCOUNTER — Inpatient Hospital Stay (HOSPITAL_COMMUNITY)
Admission: AD | Admit: 2012-12-20 | Discharge: 2012-12-20 | Disposition: A | Discharge status: Home / Self Care | Payer: Non-veteran care | Source: Ambulatory Visit | Attending: Obstetrics & Gynecology | Admitting: Obstetrics & Gynecology

## 2012-12-20 ENCOUNTER — Encounter (HOSPITAL_COMMUNITY): Payer: Self-pay | Admitting: *Deleted

## 2012-12-20 DIAGNOSIS — O9989 Other specified diseases and conditions complicating pregnancy, childbirth and the puerperium: Secondary | ICD-10-CM

## 2012-12-20 DIAGNOSIS — N898 Other specified noninflammatory disorders of vagina: Secondary | ICD-10-CM

## 2012-12-20 DIAGNOSIS — O99891 Other specified diseases and conditions complicating pregnancy: Secondary | ICD-10-CM | POA: Insufficient documentation

## 2012-12-20 NOTE — MAU Provider Note (Signed)
Chief Complaint:  Rupture of Membranes   Tanya Maxwell is a 37 y.o.  G3P1011 with IUP at [redacted]w[redacted]d presenting for Rupture of Membranes  Pt reports at 12:15 today she was riding in a vehicle when she suddenly felt something cold running down her leg. She then noted wet fluid in her underwear and pants. She denies any leakage of fluid since that time. She denies vaginal bleeding and contractions. She reports normal fetal movement.    She is receiving her prenatal care at [facility]. She reports a normal and uncomplicated prenatal course thus far.     Menstrual History: OB History   Grav Para Term Preterm Abortions TAB SAB Ect Mult Living   3 1 1  0 1 1 0 0 0 1      Patient's last menstrual period was 04/10/2012.      Past Medical History  Diagnosis Date  . Migraines     since 1996    Past Surgical History  Procedure Laterality Date  . Liposuction  01/2010    Family History  Problem Relation Age of Onset  . Heart disease Mother   . Diabetes Mother   . Hypertension Mother   . Stroke Mother   . Alcohol abuse Father   . Cancer Maternal Uncle     lymphoma  . Hypertension Paternal Grandmother   . Gout Paternal Grandmother   . Asthma Paternal Grandmother   . Anesthesia problems Neg Hx     History  Substance Use Topics  . Smoking status: Never Smoker   . Smokeless tobacco: Never Used  . Alcohol Use: No     No Known Allergies  Prescriptions prior to admission  Medication Sig Dispense Refill  . Prenatal Vit-Fe Fumarate-FA (PRENATAL MULTIVITAMIN) TABS Take 1 tablet by mouth daily.        Review of Systems - Negative except for what is mentioned in HPI.  Physical Exam  Blood pressure 116/71, pulse 72, temperature 97.8 F (36.6 C), temperature source Oral, resp. rate 16, height 5\' 5"  (1.651 m), weight 72.576 kg (160 lb), last menstrual period 04/10/2012. GENERAL: Well-developed, well-nourished female in no acute distress.  LUNGS: Clear to auscultation bilaterally.   HEART: Regular rate and rhythm. ABDOMEN: Soft, nontender, nondistended, gravid.  EXTREMITIES: Nontender, no edema, 2+ distal pulses. ROM: Negative pooling. Negative ferning.  FHT:  Baseline rate: 135 bpm   Variability: moderate  Accelerations: present   Decelerations: + variables Contractions: Every 4-5 mins   Labs: Results for orders placed during the hospital encounter of 12/20/12 (from the past 24 hour(s))  AMNISURE RUPTURE OF MEMBRANE (ROM)   Collection Time    12/20/12  2:55 PM      Result Value Range   Amnisure ROM NEGATIVE      Imaging Studies:  No results found.  Assessment: Tanya Maxwell is  37 y.o. G3P1011 at [redacted]w[redacted]d presents with concern for spontaneous rupture of membranes.    Plan: - Negative pooling, negative ferning and negative Amnisure. No evidence of rupture at this time.  - Discharge to home. Return precautions given.   Pt seen and discussed with Joellyn Haff, CNM  Priyah Schmuck 10/25/20143:23 PM

## 2012-12-20 NOTE — MAU Provider Note (Signed)
I spoke with and examined patient and agree with resident's note and plan of care.  Cheral Marker, CNM, [facility] 12/20/2012 7:55 PM

## 2012-12-20 NOTE — MAU Note (Signed)
C/o ?SROM @ 1215;

## 2012-12-21 LAB — CULTURE, BETA STREP (GROUP B ONLY)

## 2012-12-22 ENCOUNTER — Encounter: Payer: Self-pay | Admitting: Obstetrics and Gynecology

## 2012-12-25 ENCOUNTER — Ambulatory Visit (INDEPENDENT_AMBULATORY_CARE_PROVIDER_SITE_OTHER): Payer: Non-veteran care | Admitting: Obstetrics and Gynecology

## 2012-12-25 ENCOUNTER — Encounter: Payer: Self-pay | Admitting: Obstetrics and Gynecology

## 2012-12-25 VITALS — BP 121/77 | Wt 161.0 lb

## 2012-12-25 DIAGNOSIS — O9989 Other specified diseases and conditions complicating pregnancy, childbirth and the puerperium: Secondary | ICD-10-CM

## 2012-12-25 DIAGNOSIS — Z3483 Encounter for supervision of other normal pregnancy, third trimester: Secondary | ICD-10-CM

## 2012-12-25 DIAGNOSIS — O09529 Supervision of elderly multigravida, unspecified trimester: Secondary | ICD-10-CM

## 2012-12-25 DIAGNOSIS — Z3480 Encounter for supervision of other normal pregnancy, unspecified trimester: Secondary | ICD-10-CM

## 2012-12-25 DIAGNOSIS — O26893 Other specified pregnancy related conditions, third trimester: Secondary | ICD-10-CM

## 2012-12-25 NOTE — Progress Notes (Signed)
Patient is doing well without complaints. FM/labor precautions reviewed 

## 2012-12-25 NOTE — Progress Notes (Signed)
P = 76 

## 2013-01-01 ENCOUNTER — Ambulatory Visit (INDEPENDENT_AMBULATORY_CARE_PROVIDER_SITE_OTHER): Payer: Non-veteran care | Admitting: Obstetrics & Gynecology

## 2013-01-01 ENCOUNTER — Encounter: Payer: Self-pay | Admitting: Obstetrics & Gynecology

## 2013-01-01 VITALS — BP 115/67 | Wt 162.2 lb

## 2013-01-01 DIAGNOSIS — Z3480 Encounter for supervision of other normal pregnancy, unspecified trimester: Secondary | ICD-10-CM

## 2013-01-01 DIAGNOSIS — O09529 Supervision of elderly multigravida, unspecified trimester: Secondary | ICD-10-CM

## 2013-01-01 DIAGNOSIS — Z3483 Encounter for supervision of other normal pregnancy, third trimester: Secondary | ICD-10-CM

## 2013-01-01 NOTE — Patient Instructions (Signed)
Return to clinic for any obstetric concerns or go to [facility] for evaluation  

## 2013-01-01 NOTE — Progress Notes (Signed)
Bedside ultrasound verified cephalic presentation. No other complaints or concerns.  Fetal movement and labor precautions reviewed.

## 2013-01-01 NOTE — Progress Notes (Signed)
P=79 

## 2013-01-05 IMAGING — US US OB FOLLOW-UP
1 series · 12 of 28 positions shown · non-contrast
Comparison: none

[Series 1: us ob follow up · 41 acquisitions, 12 frames shown]
[im 2/41]
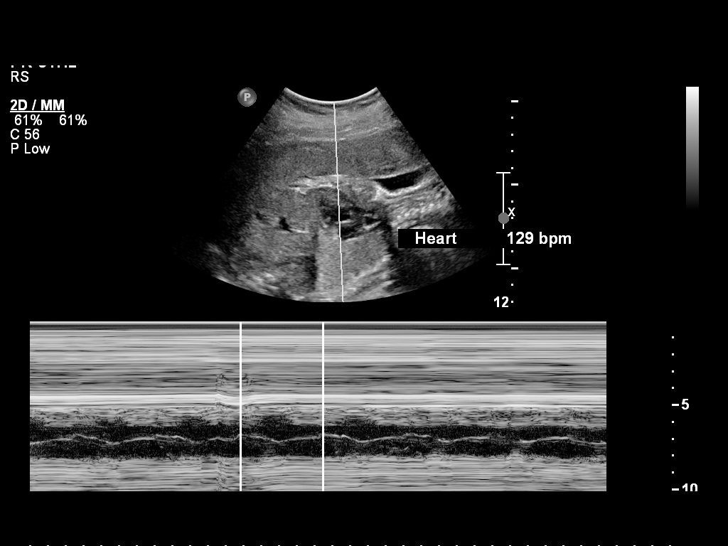
[im 5/41]
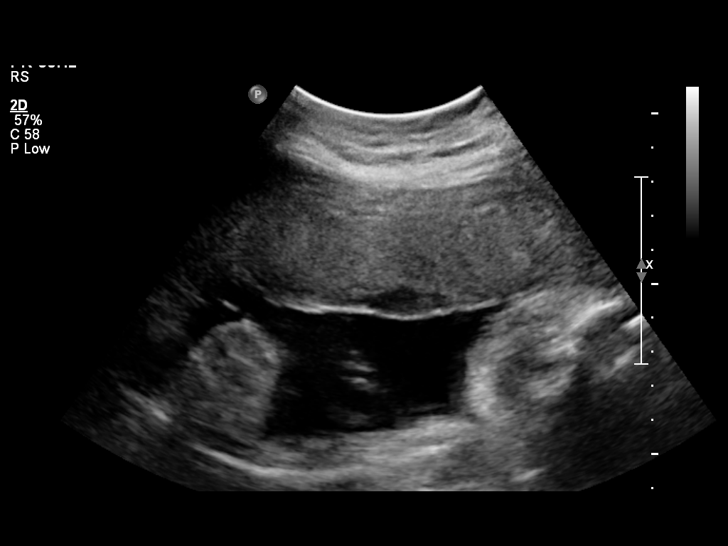
[im 8/41]
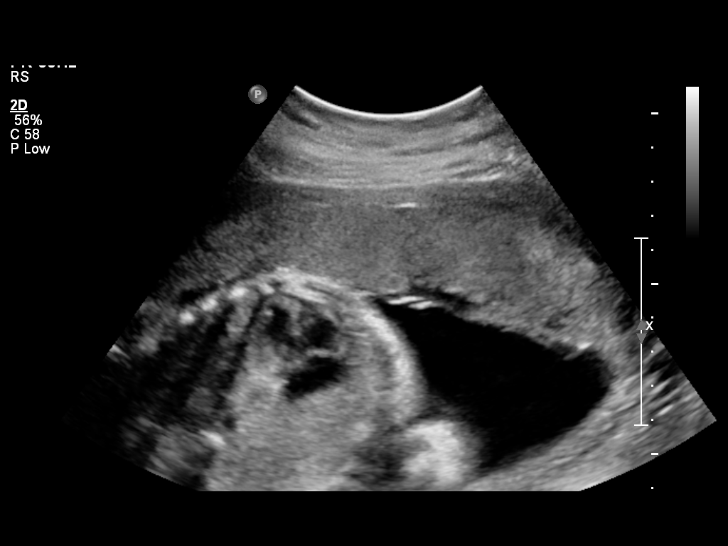
[im 12/41]
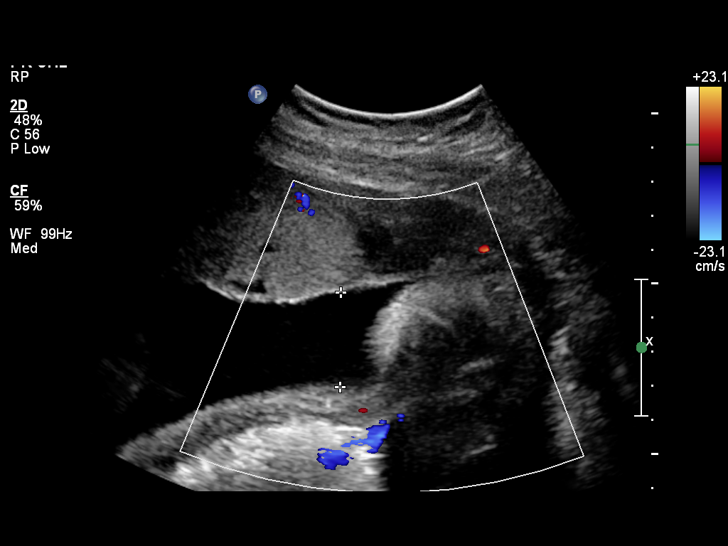
[im 15/41]
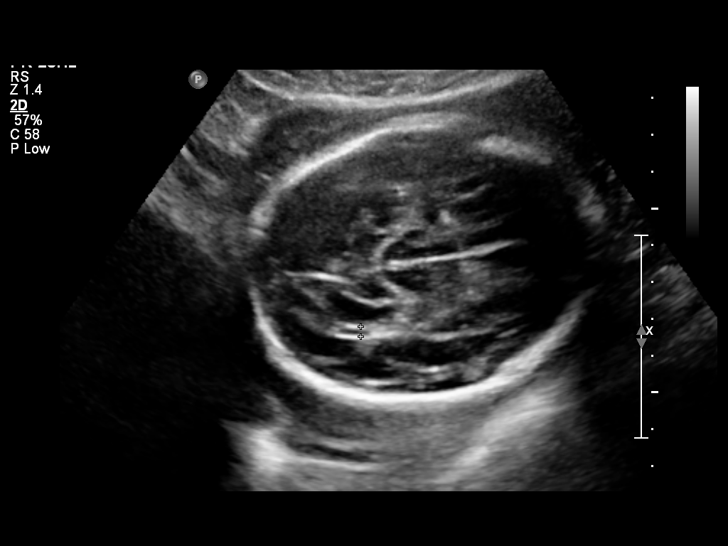
[im 18/41]
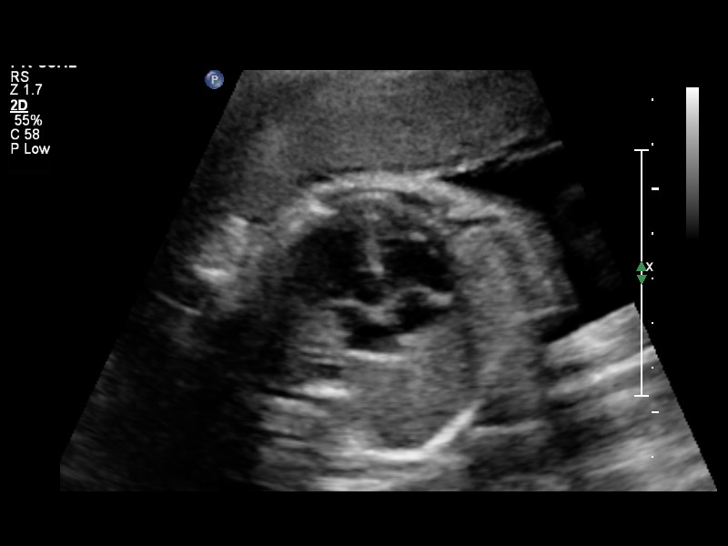
[im 23/41]
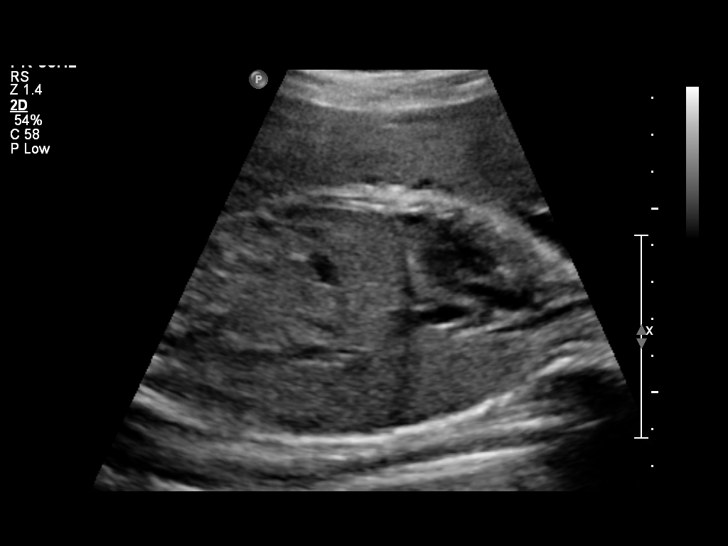
[im 26/41]
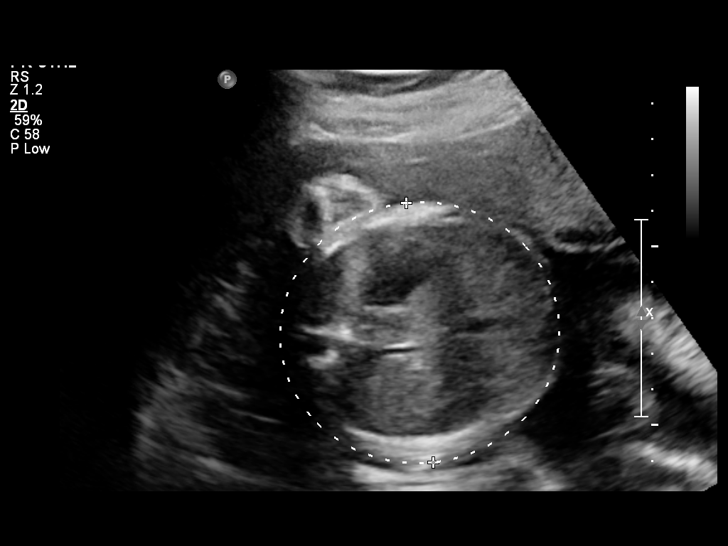
[im 29/41]
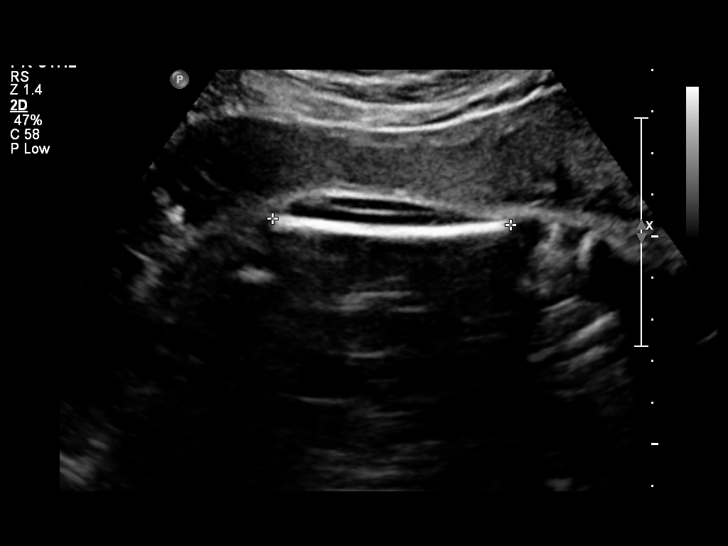
[im 33/41]
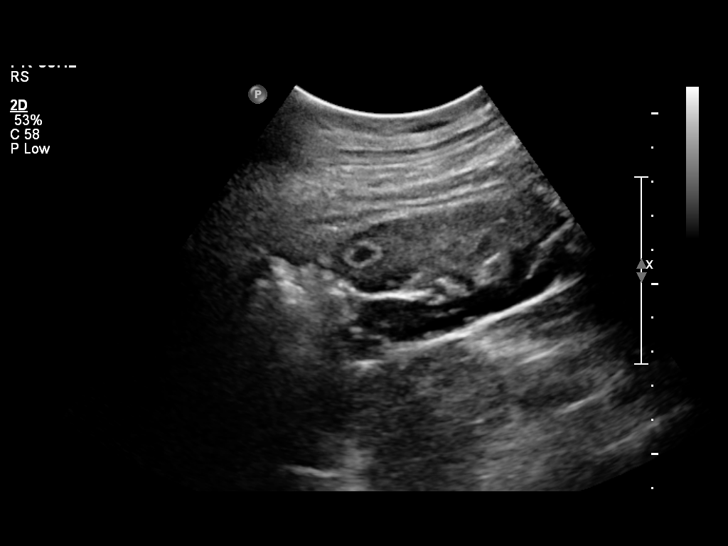
[im 36/41]
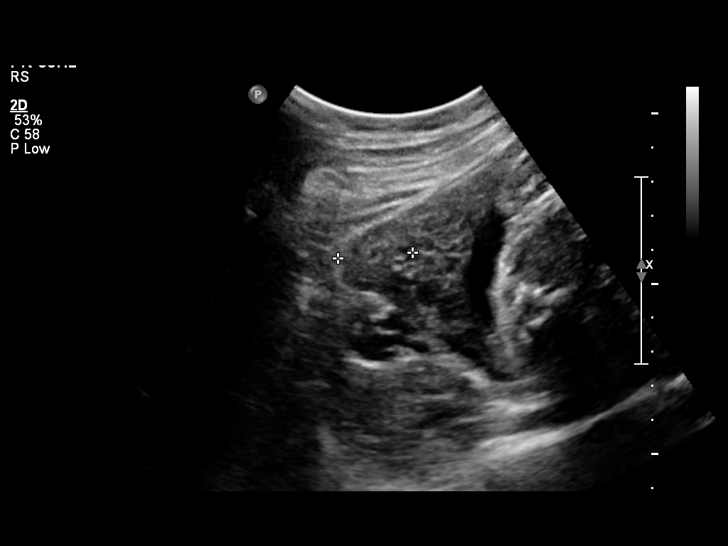
[im 39/41]
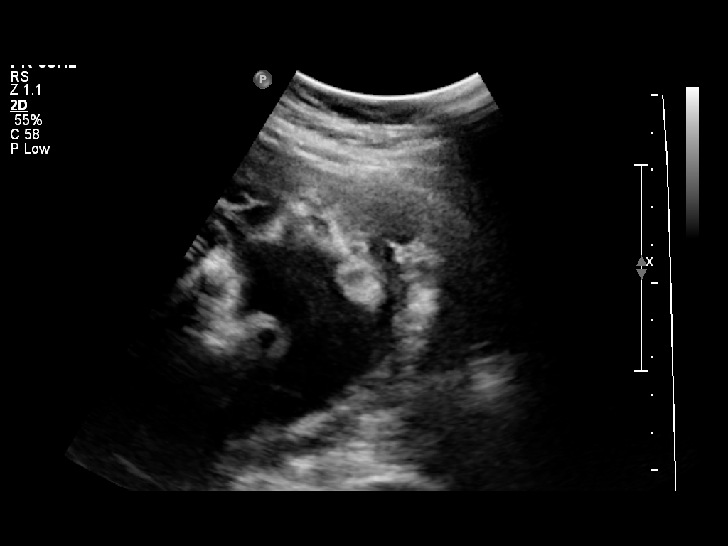

[12 of 28 positions shown; findings below may reference images not displayed]

OBSTETRICS REPORT
                      (Signed Final 04/25/2011 [DATE])

 Order#:         [id number]
Procedures

 US OB FOLLOW UP                                       76816.1
Indications

 Advanced maternal age (AMA), Multigravida
 Assess Fetal Growth / Estimated Fetal Weight
 Size less than dates (Small for gestational [AGE]
 FGR)
Fetal Evaluation

 Fetal Heart Rate:  136                         bpm
 Cardiac Activity:  Observed
 Presentation:      Cephalic
 Placenta:          Anterior, above cervical os
 P. Cord            Previously Visualized
 Insertion:

 Amniotic Fluid
 AFI FV:      Subjectively within normal limits
 AFI Sum:     16.19   cm      59   %Tile     Larg Pckt:   5.49   cm
 RUQ:   4.95   cm    RLQ:    5.49   cm    LUQ:   2.76    cm   LLQ:    2.99   cm
Biometry

 BPD:     73.7  mm    G. Age:   29w 4d                CI:        73.66   70 - 86
                                                      FL/HC:      20.8   18.8 -

 HC:     272.8  mm    G. Age:   29w 5d       65  %    HC/AC:      1.15   1.05 -

 AC:     236.4  mm    G. Age:   28w 0d       33  %    FL/BPD:     77.1   71 - 87
 FL:      56.8  mm    G. Age:   29w 6d       78  %    FL/AC:      24.0   20 - 24

 Est. FW:    3075  gm    2 lb 14 oz      62  %
Gestational Age

 LMP:           28w 2d       Date:   10/09/10                 EDD:   07/16/11
 U/S Today:     29w 2d                                        EDD:   07/09/11
 Best:          28w 2d    Det. By:   LMP  (10/09/10)          EDD:   07/16/11
Anatomy

 Cranium:           Appears normal      Aortic Arch:       Previously seen
 Fetal Cavum:       Previously seen     Ductal Arch:       Previously seen
 Ventricles:        Appears normal      Diaphragm:         Appears normal
 Choroid Plexus:    Previously seen     Stomach:           Appears normal
 Cerebellum:        Previously seen     Abdomen:           Appears normal
 Posterior Fossa:   Previously seen     Abdominal Wall:    Previously seen
 Nuchal Fold:       Not applicable      Cord Vessels:      Previously seen
                    (>20 wks GA)
 Face:              Previously seen     Kidneys:           Appear normal
 Heart:             Appears normal      Bladder:           Appears normal
                    (4 chamber &
                    axis)
 RVOT:              Previously seen     Spine:             Previously seen
 LVOT:              Previously seen     Limbs:             Previously seen

 Other:     Male gender previously seen. Heels, 5th digit and Nasal
            bone previously seen.
Targeted Anatomy

 Fetal Central Nervous System
 Lat. Ventricles:
Cervix Uterus Adnexa

 Cervical Length:   3.2       cm

 Cervix:       Normal appearance by transabdominal scan.

 Left Ovary:   Not visualized.
 Right Ovary:  Within normal limits.
 Adnexa:     No abnormality visualized.
Impression

 Single living IUP with assigned GA of 28w 2d. Appropriate
 fetal growth. EFW is at the 62nd percentile.
 Normal amniotic fluid volume and cervical length.
 Visualized fetal anatomy appears normal.

## 2013-01-08 ENCOUNTER — Encounter: Payer: Self-pay | Admitting: Obstetrics & Gynecology

## 2013-01-08 ENCOUNTER — Ambulatory Visit (INDEPENDENT_AMBULATORY_CARE_PROVIDER_SITE_OTHER): Payer: Non-veteran care | Admitting: Obstetrics & Gynecology

## 2013-01-08 VITALS — BP 124/77 | Wt 165.0 lb

## 2013-01-08 DIAGNOSIS — Z3480 Encounter for supervision of other normal pregnancy, unspecified trimester: Secondary | ICD-10-CM

## 2013-01-08 DIAGNOSIS — O09529 Supervision of elderly multigravida, unspecified trimester: Secondary | ICD-10-CM

## 2013-01-08 DIAGNOSIS — Z3483 Encounter for supervision of other normal pregnancy, third trimester: Secondary | ICD-10-CM

## 2013-01-08 NOTE — Progress Notes (Signed)
P-75 

## 2013-01-08 NOTE — Progress Notes (Signed)
Routine visit. Good FM. No  Problems. Labor precautions reviewed. She declines a vaginal exam today. I have scheduled an IOL for AMA on 01-15-13 at 1930. She declines a flu vaccine.

## 2013-01-09 ENCOUNTER — Encounter (HOSPITAL_COMMUNITY): Payer: Self-pay | Admitting: *Deleted

## 2013-01-09 ENCOUNTER — Telehealth (HOSPITAL_COMMUNITY): Payer: Self-pay | Admitting: *Deleted

## 2013-01-09 NOTE — Telephone Encounter (Signed)
Preadmission screen  

## 2013-01-15 ENCOUNTER — Ambulatory Visit (INDEPENDENT_AMBULATORY_CARE_PROVIDER_SITE_OTHER): Payer: Non-veteran care | Admitting: *Deleted

## 2013-01-15 ENCOUNTER — Inpatient Hospital Stay (HOSPITAL_COMMUNITY): Admission: RE | Admit: 2013-01-15 | Payer: Non-veteran care | Source: Ambulatory Visit

## 2013-01-15 VITALS — BP 128/84 | Wt 165.0 lb

## 2013-01-15 DIAGNOSIS — O480 Post-term pregnancy: Secondary | ICD-10-CM

## 2013-01-15 DIAGNOSIS — O09519 Supervision of elderly primigravida, unspecified trimester: Secondary | ICD-10-CM

## 2013-01-15 NOTE — Progress Notes (Signed)
P-73 

## 2013-01-16 ENCOUNTER — Inpatient Hospital Stay (HOSPITAL_COMMUNITY): Admission: RE | Admit: 2013-01-16 | Payer: Non-veteran care | Source: Ambulatory Visit

## 2013-01-19 ENCOUNTER — Encounter: Payer: Self-pay | Admitting: Family Medicine

## 2013-01-19 ENCOUNTER — Ambulatory Visit (INDEPENDENT_AMBULATORY_CARE_PROVIDER_SITE_OTHER): Payer: Non-veteran care | Admitting: Family Medicine

## 2013-01-19 VITALS — BP 124/77 | Wt 167.0 lb

## 2013-01-19 DIAGNOSIS — O480 Post-term pregnancy: Secondary | ICD-10-CM

## 2013-01-19 DIAGNOSIS — Z3480 Encounter for supervision of other normal pregnancy, unspecified trimester: Secondary | ICD-10-CM

## 2013-01-19 DIAGNOSIS — O09529 Supervision of elderly multigravida, unspecified trimester: Secondary | ICD-10-CM

## 2013-01-19 NOTE — Progress Notes (Signed)
NST reviewed and reactive. AFI For IOL on 11/28.

## 2013-01-19 NOTE — Progress Notes (Signed)
P = 77 

## 2013-01-19 NOTE — Patient Instructions (Signed)
Third Trimester of Pregnancy The third trimester is from week 29 through week 42, months 7 through 9. The third trimester is a time when the fetus is growing rapidly. At the end of the ninth month, the fetus is about 20 inches in length and weighs 6 10 pounds.  BODY CHANGES Your body goes through many changes during pregnancy. The changes vary from woman to woman.   Your weight will continue to increase. You can expect to gain 25 35 pounds (11 16 kg) by the end of the pregnancy.  You may begin to get stretch marks on your hips, abdomen, and breasts.  You may urinate more often because the fetus is moving lower into your pelvis and pressing on your bladder.  You may develop or continue to have heartburn as a result of your pregnancy.  You may develop constipation because certain hormones are causing the muscles that push waste through your intestines to slow down.  You may develop hemorrhoids or swollen, bulging veins (varicose veins).  You may have pelvic pain because of the weight gain and pregnancy hormones relaxing your joints between the bones in your pelvis. Back aches may result from over exertion of the muscles supporting your posture.  Your breasts will continue to grow and be tender. A yellow discharge may leak from your breasts called colostrum.  Your belly button may stick out.  You may feel short of breath because of your expanding uterus.  You may notice the fetus "dropping," or moving lower in your abdomen.  You may have a bloody mucus discharge. This usually occurs a few days to a week before labor begins.  Your cervix becomes thin and soft (effaced) near your due date. WHAT TO EXPECT AT YOUR PRENATAL EXAMS  You will have prenatal exams every 2 weeks until week 36. Then, you will have weekly prenatal exams. During a routine prenatal visit:  You will be weighed to make sure you and the fetus are growing normally.  Your blood pressure is taken.  Your abdomen will  be measured to track your baby's growth.  The fetal heartbeat will be listened to.  Any test results from the previous visit will be discussed.  You may have a cervical check near your due date to see if you have effaced. At around 36 weeks, your caregiver will check your cervix. At the same time, your caregiver will also perform a test on the secretions of the vaginal tissue. This test is to determine if a type of bacteria, Group B streptococcus, is present. Your caregiver will explain this further. Your caregiver may ask you:  What your birth plan is.  How you are feeling.  If you are feeling the baby move.  If you have had any abnormal symptoms, such as leaking fluid, bleeding, severe headaches, or abdominal cramping.  If you have any questions. Other tests or screenings that may be performed during your third trimester include:  Blood tests that check for low iron levels (anemia).  Fetal testing to check the health, activity level, and growth of the fetus. Testing is done if you have certain medical conditions or if there are problems during the pregnancy. FALSE LABOR You may feel small, irregular contractions that eventually go away. These are called Braxton Hicks contractions, or false labor. Contractions may last for hours, days, or even weeks before true labor sets in. If contractions come at regular intervals, intensify, or become painful, it is best to be seen by your caregiver.    SIGNS OF LABOR   Menstrual-like cramps.  Contractions that are 5 minutes apart or less.  Contractions that start on the top of the uterus and spread down to the lower abdomen and back.  A sense of increased pelvic pressure or back pain.  A watery or bloody mucus discharge that comes from the vagina. If you have any of these signs before the 37th week of pregnancy, call your caregiver right away. You need to go to the hospital to get checked immediately. HOME CARE INSTRUCTIONS   Avoid all  smoking, herbs, alcohol, and unprescribed drugs. These chemicals affect the formation and growth of the baby.  Follow your caregiver's instructions regarding medicine use. There are medicines that are either safe or unsafe to take during pregnancy.  Exercise only as directed by your caregiver. Experiencing uterine cramps is a good sign to stop exercising.  Continue to eat regular, healthy meals.  Wear a good support bra for breast tenderness.  Do not use hot tubs, steam rooms, or saunas.  Wear your seat belt at all times when driving.  Avoid raw meat, uncooked cheese, cat litter boxes, and soil used by cats. These carry germs that can cause birth defects in the baby.  Take your prenatal vitamins.  Try taking a stool softener (if your caregiver approves) if you develop constipation. Eat more high-fiber foods, such as fresh vegetables or fruit and whole grains. Drink plenty of fluids to keep your urine clear or pale yellow.  Take warm sitz baths to soothe any pain or discomfort caused by hemorrhoids. Use hemorrhoid cream if your caregiver approves.  If you develop varicose veins, wear support hose. Elevate your feet for 15 minutes, 3 4 times a day. Limit salt in your diet.  Avoid heavy lifting, wear low heal shoes, and practice good posture.  Rest a lot with your legs elevated if you have leg cramps or low back pain.  Visit your dentist if you have not gone during your pregnancy. Use a soft toothbrush to brush your teeth and be gentle when you floss.  A sexual relationship may be continued unless your caregiver directs you otherwise.  Do not travel far distances unless it is absolutely necessary and only with the approval of your caregiver.  Take prenatal classes to understand, practice, and ask questions about the labor and delivery.  Make a trial run to the hospital.  Pack your hospital bag.  Prepare the baby's nursery.  Continue to go to all your prenatal visits as directed  by your caregiver. SEEK MEDICAL CARE IF:  You are unsure if you are in labor or if your water has broken.  You have dizziness.  You have mild pelvic cramps, pelvic pressure, or nagging pain in your abdominal area.  You have persistent nausea, vomiting, or diarrhea.  You have a bad smelling vaginal discharge.  You have pain with urination. SEEK IMMEDIATE MEDICAL CARE IF:   You have a fever.  You are leaking fluid from your vagina.  You have spotting or bleeding from your vagina.  You have severe abdominal cramping or pain.  You have rapid weight loss or gain.  You have shortness of breath with chest pain.  You notice sudden or extreme swelling of your face, hands, ankles, feet, or legs.  You have not felt your baby move in over an hour.  You have severe headaches that do not go away with medicine.  You have vision changes. Document Released: 02/06/2001 Document Revised: 10/15/2012 Document Reviewed:   04/15/2012 [employer] Patient Information 2014 [employer].  Breastfeeding Deciding to breastfeed is one of the best choices you can make for you and your baby. A change in hormones during pregnancy causes your breast tissue to grow and increases the number and size of your milk ducts. These hormones also allow proteins, sugars, and fats from your blood supply to make breast milk in your milk-producing glands. Hormones prevent breast milk from being released before your baby is born as well as prompt milk flow after birth. Once breastfeeding has begun, thoughts of your baby, as well as his or her sucking or crying, can stimulate the release of milk from your milk-producing glands.  BENEFITS OF BREASTFEEDING For Your Baby  Your first milk (colostrum) helps your baby's digestive system function better.   There are antibodies in your milk that help your baby fight off infections.   Your baby has a lower incidence of asthma, allergies, and sudden infant death syndrome.    The nutrients in breast milk are better for your baby than infant formulas and are designed uniquely for your baby's needs.   Breast milk improves your baby's brain development.   Your baby is less likely to develop other conditions, such as childhood obesity, asthma, or type 2 diabetes mellitus.  For You   Breastfeeding helps to create a very special bond between you and your baby.   Breastfeeding is convenient. Breast milk is always available at the correct temperature and costs nothing.   Breastfeeding helps to burn calories and helps you lose the weight gained during pregnancy.   Breastfeeding makes your uterus contract to its prepregnancy size faster and slows bleeding (lochia) after you give birth.   Breastfeeding helps to lower your risk of developing type 2 diabetes mellitus, osteoporosis, and breast or ovarian cancer later in life. SIGNS THAT YOUR BABY IS HUNGRY Early Signs of Hunger  Increased alertness or activity.  Stretching.  Movement of the head from side to side.  Movement of the head and opening of the mouth when the corner of the mouth or cheek is stroked (rooting).  Increased sucking sounds, smacking lips, cooing, sighing, or squeaking.  Hand-to-mouth movements.  Increased sucking of fingers or hands. Late Signs of Hunger  Fussing.  Intermittent crying. Extreme Signs of Hunger Signs of extreme hunger will require calming and consoling before your baby will be able to breastfeed successfully. Do not wait for the following signs of extreme hunger to occur before you initiate breastfeeding:   Restlessness.  A loud, strong cry.   Screaming. BREASTFEEDING BASICS Breastfeeding Initiation  Find a comfortable place to sit or lie down, with your neck and back well supported.  Place a pillow or rolled up blanket under your baby to bring him or her to the level of your breast (if you are seated). Nursing pillows are specially designed to help  support your arms and your baby while you breastfeed.  Make sure that your baby's abdomen is facing your abdomen.   Gently massage your breast. With your fingertips, massage from your chest wall toward your nipple in a circular motion. This encourages milk flow. You may need to continue this action during the feeding if your milk flows slowly.  Support your breast with 4 fingers underneath and your thumb above your nipple. Make sure your fingers are well away from your nipple and your baby's mouth.   Stroke your baby's lips gently with your finger or nipple.   When your baby's mouth is   open wide enough, quickly bring your baby to your breast, placing your entire nipple and as much of the colored area around your nipple (areola) as possible into your baby's mouth.   More areola should be visible above your baby's upper lip than below the lower lip.   Your baby's tongue should be between his or her lower gum and your breast.   Ensure that your baby's mouth is correctly positioned around your nipple (latched). Your baby's lips should create a seal on your breast and be turned out (everted).  It is common for your baby to suck about 2 3 minutes in order to start the flow of breast milk. Latching Teaching your baby how to latch on to your breast properly is very important. An improper latch can cause nipple pain and decreased milk supply for you and poor weight gain in your baby. Also, if your baby is not latched onto your nipple properly, he or she may swallow some air during feeding. This can make your baby fussy. Burping your baby when you switch breasts during the feeding can help to get rid of the air. However, teaching your baby to latch on properly is still the best way to prevent fussiness from swallowing air while breastfeeding. Signs that your baby has successfully latched on to your nipple:    Silent tugging or silent sucking, without causing you pain.   Swallowing heard  between every 3 4 sucks.    Muscle movement above and in front of his or her ears while sucking.  Signs that your baby has not successfully latched on to nipple:   Sucking sounds or smacking sounds from your baby while breastfeeding.  Nipple pain. If you think your baby has not latched on correctly, slip your finger into the corner of your baby's mouth to break the suction and place it between your baby's gums. Attempt breastfeeding initiation again. Signs of Successful Breastfeeding Signs from your baby:   A gradual decrease in the number of sucks or complete cessation of sucking.   Falling asleep.   Relaxation of his or her body.   Retention of a small amount of milk in his or her mouth.   Letting go of your breast by himself or herself. Signs from you:  Breasts that have increased in firmness, weight, and size 1 3 hours after feeding.   Breasts that are softer immediately after breastfeeding.  Increased milk volume, as well as a change in milk consistency and color by the 5th day of breastfeeding.   Nipples that are not sore, cracked, or bleeding. Signs That Your Baby is Getting Enough Milk  Wetting at least 3 diapers in a 24-hour period. The urine should be clear and pale yellow by age 5 days.  At least 3 stools in a 24-hour period by age 5 days. The stool should be soft and yellow.  At least 3 stools in a 24-hour period by age 7 days. The stool should be seedy and yellow.  No loss of weight greater than 10% of birth weight during the first 3 days of age.  Average weight gain of 4 7 ounces (120 210 mL) per week after age 4 days.  Consistent daily weight gain by age 5 days, without weight loss after the age of 2 weeks. After a feeding, your baby may spit up a small amount. This is common. BREASTFEEDING FREQUENCY AND DURATION Frequent feeding will help you make more milk and can prevent sore nipples and breast engorgement.   Breastfeed when you feel the need to  reduce the fullness of your breasts or when your baby shows signs of hunger. This is called "breastfeeding on demand." Avoid introducing a pacifier to your baby while you are working to establish breastfeeding (the first 4 6 weeks after your baby is born). After this time you may choose to use a pacifier. Research has shown that pacifier use during the first year of a baby's life decreases the risk of sudden infant death syndrome (SIDS). Allow your baby to feed on each breast as long as he or she wants. Breastfeed until your baby is finished feeding. When your baby unlatches or falls asleep while feeding from the first breast, offer the second breast. Because newborns are often sleepy in the first few weeks of life, you may need to awaken your baby to get him or her to feed. Breastfeeding times will vary from baby to baby. However, the following rules can serve as a guide to help you ensure that your baby is properly fed:  Newborns (babies 4 weeks of age or younger) may breastfeed every 1 3 hours.  Newborns should not go longer than 3 hours during the day or 5 hours during the night without breastfeeding.  You should breastfeed your baby a minimum of 8 times in a 24-hour period until you begin to introduce solid foods to your baby at around 6 months of age. BREAST MILK PUMPING Pumping and storing breast milk allows you to ensure that your baby is exclusively fed your breast milk, even at times when you are unable to breastfeed. This is especially important if you are going back to work while you are still breastfeeding or when you are not able to be present during feedings. Your lactation consultant can give you guidelines on how long it is safe to store breast milk.  A breast pump is a machine that allows you to pump milk from your breast into a sterile bottle. The pumped breast milk can then be stored in a refrigerator or freezer. Some breast pumps are operated by hand, while others use electricity. Ask  your lactation consultant which type will work best for you. Breast pumps can be purchased, but some hospitals and breastfeeding support groups lease breast pumps on a monthly basis. A lactation consultant can teach you how to hand express breast milk, if you prefer not to use a pump.  CARING FOR YOUR BREASTS WHILE YOU BREASTFEED Nipples can become dry, cracked, and sore while breastfeeding. The following recommendations can help keep your breasts moisturized and healthy:  Avoid using soap on your nipples.   Wear a supportive bra. Although not required, special nursing bras and tank tops are designed to allow access to your breasts for breastfeeding without taking off your entire bra or top. Avoid wearing underwire style bras or extremely tight bras.  Air dry your nipples for 3 4minutes after each feeding.   Use only cotton bra pads to absorb leaked breast milk. Leaking of breast milk between feedings is normal.   Use lanolin on your nipples after breastfeeding. Lanolin helps to maintain your skin's normal moisture barrier. If you use pure lanolin you do not need to wash it off before feeding your baby again. Pure lanolin is not toxic to your baby. You may also hand express a few drops of breast milk and gently massage that milk into your nipples and allow the milk to air dry. In the first few weeks after giving birth, some women   experience extremely full breasts (engorgement). Engorgement can make your breasts feel heavy, warm, and tender to the touch. Engorgement peaks within 3 5 days after you give birth. The following recommendations can help ease engorgement:  Completely empty your breasts while breastfeeding or pumping. You may want to start by applying warm, moist heat (in the shower or with warm water-soaked hand towels) just before feeding or pumping. This increases circulation and helps the milk flow. If your baby does not completely empty your breasts while breastfeeding, pump any extra  milk after he or she is finished.  Wear a snug bra (nursing or regular) or tank top for 1 2 days to signal your body to slightly decrease milk production.  Apply ice packs to your breasts, unless this is too uncomfortable for you.  Make sure that your baby is latched on and positioned properly while breastfeeding. If engorgement persists after 48 hours of following these recommendations, contact your health care provider or a lactation consultant. OVERALL HEALTH CARE RECOMMENDATIONS WHILE BREASTFEEDING  Eat healthy foods. Alternate between meals and snacks, eating 3 of each per day. Because what you eat affects your breast milk, some of the foods may make your baby more irritable than usual. Avoid eating these foods if you are sure that they are negatively affecting your baby.  Drink milk, fruit juice, and water to satisfy your thirst (about 10 glasses a day).   Rest often, relax, and continue to take your prenatal vitamins to prevent fatigue, stress, and anemia.  Continue breast self-awareness checks.  Avoid chewing and smoking tobacco.  Avoid alcohol and drug use. Some medicines that may be harmful to your baby can pass through breast milk. It is important to ask your health care provider before taking any medicine, including all over-the-counter and prescription medicine as well as vitamin and herbal supplements. It is possible to become pregnant while breastfeeding. If birth control is desired, ask your health care provider about options that will be safe for your baby. SEEK MEDICAL CARE IF:   You feel like you want to stop breastfeeding or have become frustrated with breastfeeding.  You have painful breasts or nipples.  Your nipples are cracked or bleeding.  Your breasts are red, tender, or warm.  You have a swollen area on either breast.  You have a fever or chills.  You have nausea or vomiting.  You have drainage other than breast milk from your nipples.  Your breasts  do not become full before feedings by the 5th day after you give birth.  You feel sad and depressed.  Your baby is too sleepy to eat well.  Your baby is having trouble sleeping.   Your baby is wetting less than 3 diapers in a 24-hour period.  Your baby has less than 3 stools in a 24-hour period.  Your baby's skin or the white part of his or her eyes becomes yellow.   Your baby is not gaining weight by 5 days of age. SEEK IMMEDIATE MEDICAL CARE IF:   Your baby is overly tired (lethargic) and does not want to wake up and feed.  Your baby develops an unexplained fever. Document Released: 02/12/2005 Document Revised: 10/15/2012 Document Reviewed: 08/06/2012 [employer] Patient Information 2014 [employer].  

## 2013-01-19 NOTE — Progress Notes (Signed)
AFI=13.8cm

## 2013-01-21 ENCOUNTER — Inpatient Hospital Stay (HOSPITAL_COMMUNITY)
Admission: AD | Admit: 2013-01-21 | Discharge: 2013-01-23 | DRG: 775 | Disposition: A | Discharge status: Home / Self Care | Payer: Non-veteran care | Source: Ambulatory Visit | Attending: Obstetrics & Gynecology | Admitting: Obstetrics & Gynecology

## 2013-01-21 ENCOUNTER — Ambulatory Visit (INDEPENDENT_AMBULATORY_CARE_PROVIDER_SITE_OTHER): Payer: Non-veteran care | Admitting: *Deleted

## 2013-01-21 ENCOUNTER — Encounter (HOSPITAL_COMMUNITY): Payer: Self-pay

## 2013-01-21 DIAGNOSIS — O480 Post-term pregnancy: Secondary | ICD-10-CM

## 2013-01-21 DIAGNOSIS — O09529 Supervision of elderly multigravida, unspecified trimester: Secondary | ICD-10-CM | POA: Diagnosis present

## 2013-01-21 DIAGNOSIS — IMO0001 Reserved for inherently not codable concepts without codable children: Secondary | ICD-10-CM

## 2013-01-21 LAB — CBC
HCT: 35.6 % — ABNORMAL LOW (ref 36.0–46.0)
Hemoglobin: 12.5 g/dL (ref 12.0–15.0)
MCH: 30.1 pg (ref 26.0–34.0)
MCHC: 35.1 g/dL (ref 30.0–36.0)
MCV: 85.8 fL (ref 78.0–100.0)
Platelets: 181 10*3/uL (ref 150–400)
RDW: 13.3 % (ref 11.5–15.5)
WBC: 11.1 10*3/uL — ABNORMAL HIGH (ref 4.0–10.5)

## 2013-01-21 MED ORDER — ONDANSETRON HCL 4 MG/2ML IJ SOLN: 4.0000 mg | INTRAMUSCULAR | Status: DC | PRN

## 2013-01-21 MED ORDER — OXYTOCIN 10 UNIT/ML IJ SOLN
INTRAMUSCULAR | Status: AC
  Filled 2013-01-21: qty 1

## 2013-01-21 MED ORDER — OXYCODONE-ACETAMINOPHEN 5-325 MG PO TABS
1.0000 | ORAL_TABLET | ORAL | Status: DC | PRN
  Administered 2013-01-21: 2 via ORAL
  Administered 2013-01-21: 1 via ORAL
  Administered 2013-01-22: 2 via ORAL
  Administered 2013-01-22 (×2): 1 via ORAL
  Administered 2013-01-22: 2 via ORAL
  Filled 2013-01-21 (×6): qty 1
  Filled 2013-01-21: qty 2
  Filled 2013-01-21: qty 1

## 2013-01-21 MED ORDER — DIPHENHYDRAMINE HCL 25 MG PO CAPS: 25.0000 mg | ORAL_CAPSULE | Freq: Four times a day (QID) | ORAL | Status: DC | PRN

## 2013-01-21 MED ORDER — LIDOCAINE HCL (PF) 1 % IJ SOLN
30.0000 mL | INTRAMUSCULAR | Status: DC | PRN
  Filled 2013-01-21: qty 30

## 2013-01-21 MED ORDER — LACTATED RINGERS IV SOLN: INTRAVENOUS | Status: DC

## 2013-01-21 MED ORDER — ONDANSETRON HCL 4 MG PO TABS: 4.0000 mg | ORAL_TABLET | ORAL | Status: DC | PRN

## 2013-01-21 MED ORDER — LIDOCAINE HCL (PF) 1 % IJ SOLN
INTRAMUSCULAR | Status: AC
  Filled 2013-01-21: qty 30

## 2013-01-21 MED ORDER — CITRIC ACID-SODIUM CITRATE 334-500 MG/5ML PO SOLN: 30.0000 mL | ORAL | Status: DC | PRN

## 2013-01-21 MED ORDER — TETANUS-DIPHTH-ACELL PERTUSSIS 5-2.5-18.5 LF-MCG/0.5 IM SUSP
0.5000 mL | Freq: Once | INTRAMUSCULAR | Status: AC
  Administered 2013-01-23: 0.5 mL via INTRAMUSCULAR
  Filled 2013-01-21: qty 0.5

## 2013-01-21 MED ORDER — BENZOCAINE-MENTHOL 20-0.5 % EX AERO
1.0000 "application " | INHALATION_SPRAY | CUTANEOUS | Status: DC | PRN
  Administered 2013-01-23: 1 via TOPICAL
  Filled 2013-01-21: qty 56

## 2013-01-21 MED ORDER — OXYCODONE-ACETAMINOPHEN 5-325 MG PO TABS
1.0000 | ORAL_TABLET | ORAL | Status: DC | PRN
  Administered 2013-01-21: 1 via ORAL
  Filled 2013-01-21: qty 1

## 2013-01-21 MED ORDER — LACTATED RINGERS IV SOLN: 500.0000 mL | INTRAVENOUS | Status: DC | PRN

## 2013-01-21 MED ORDER — ZOLPIDEM TARTRATE 5 MG PO TABS: 5.0000 mg | ORAL_TABLET | Freq: Every evening | ORAL | Status: DC | PRN

## 2013-01-21 MED ORDER — IBUPROFEN 600 MG PO TABS
600.0000 mg | ORAL_TABLET | Freq: Four times a day (QID) | ORAL | Status: DC | PRN
  Administered 2013-01-21: 600 mg via ORAL
  Filled 2013-01-21: qty 1

## 2013-01-21 MED ORDER — WITCH HAZEL-GLYCERIN EX PADS: 1.0000 "application " | MEDICATED_PAD | CUTANEOUS | Status: DC | PRN

## 2013-01-21 MED ORDER — PRENATAL MULTIVITAMIN CH
1.0000 | ORAL_TABLET | Freq: Every day | ORAL | Status: DC
  Administered 2013-01-22 – 2013-01-23 (×2): 1 via ORAL
  Filled 2013-01-21 (×2): qty 1

## 2013-01-21 MED ORDER — ONDANSETRON HCL 4 MG/2ML IJ SOLN: 4.0000 mg | Freq: Four times a day (QID) | INTRAMUSCULAR | Status: DC | PRN

## 2013-01-21 MED ORDER — OXYTOCIN BOLUS FROM INFUSION: 500.0000 mL | INTRAVENOUS | Status: DC

## 2013-01-21 MED ORDER — LANOLIN HYDROUS EX OINT: TOPICAL_OINTMENT | CUTANEOUS | Status: DC | PRN

## 2013-01-21 MED ORDER — ERYTHROMYCIN 5 MG/GM OP OINT: TOPICAL_OINTMENT | Freq: Once | OPHTHALMIC | Status: DC

## 2013-01-21 MED ORDER — DIBUCAINE 1 % RE OINT: 1.0000 "application " | TOPICAL_OINTMENT | RECTAL | Status: DC | PRN

## 2013-01-21 MED ORDER — IBUPROFEN 600 MG PO TABS
600.0000 mg | ORAL_TABLET | Freq: Four times a day (QID) | ORAL | Status: DC
  Administered 2013-01-21 – 2013-01-23 (×7): 600 mg via ORAL
  Filled 2013-01-21 (×7): qty 1

## 2013-01-21 MED ORDER — SIMETHICONE 80 MG PO CHEW: 80.0000 mg | CHEWABLE_TABLET | ORAL | Status: DC | PRN

## 2013-01-21 MED ORDER — ACETAMINOPHEN 325 MG PO TABS: 650.0000 mg | ORAL_TABLET | ORAL | Status: DC | PRN

## 2013-01-21 MED ORDER — OXYTOCIN 40 UNITS IN LACTATED RINGERS INFUSION - SIMPLE MED: 62.5000 mL/h | INTRAVENOUS | Status: DC

## 2013-01-21 MED ORDER — SENNOSIDES-DOCUSATE SODIUM 8.6-50 MG PO TABS
2.0000 | ORAL_TABLET | ORAL | Status: DC
  Administered 2013-01-21 – 2013-01-22 (×2): 2 via ORAL
  Filled 2013-01-21 (×2): qty 2

## 2013-01-21 NOTE — H&P (Signed)
Tanya Maxwell is a 37 y.o. female presenting for SROM and contractions that started at 1300 today.  Pt at [facility] office with EDC determined by LMP confirmed by 13 wk ultrasound.  No complication during pregnancy other than marginal cord insertion and  AMA with low risk free cell DNA test.    Maternal Medical History:  Reason for admission: Rupture of membranes and contractions.   Contractions: Onset was 1-2 hours ago.   Frequency: regular.   Perceived severity is strong.    Fetal activity: Perceived fetal activity is normal.   Last perceived fetal movement was within the past hour.    Prenatal complications: no prenatal complications   OB History   Grav Para Term Preterm Abortions TAB SAB Ect Mult Living   3 1 1  0 1 1 0 0 0 1     Past Medical History  Diagnosis Date  . Migraines     since 1996   Past Surgical History  Procedure Laterality Date  . Liposuction  01/2010   Family History: family history includes Alcohol abuse in her father; Asthma in her paternal grandmother; Cancer in her maternal uncle; Diabetes in her mother; Gout in her paternal grandmother; Heart disease in her mother; Hypertension in her mother and paternal grandmother; Stroke in her mother. There is no history of Anesthesia problems. Social History:  reports that she has never smoked. She has never used smokeless tobacco. She reports that she does not drink alcohol or use illicit drugs.   Prenatal Transfer Tool  Maternal Diabetes: No Genetic Screening: Normal Maternal Ultrasounds/Referrals: Abnormal:  Findings:   Fetal renal pyelectasis, Other:Bilat renal pylectasis resolved at 30 wks.   Fetal Ultrasounds or other Referrals:  None Maternal Substance Abuse:  No Significant Maternal Medications:  None Significant Maternal Lab Results:  Lab values include: Group B Strep negative Other Comments:  Light Meconium  Review of Systems  Gastrointestinal: Positive for abdominal pain (contractions).   Genitourinary:       Rupture of membranes  All other systems reviewed and are negative.      Last menstrual period 04/10/2012. Maternal Exam:  Uterine Assessment: Contraction strength is firm.  Contraction frequency is regular.   Abdomen: Estimated fetal weight is 7-7.5 lbs.   Fetal presentation: vertex  Introitus: Normal vulva. Vagina is positive for vaginal discharge (yellowish meconium seen on perineum).    Fetal Exam Fetal Monitor Review: Mode: hand-held doppler probe.   Baseline rate: 120's - pt moving rapidly unable to put on fetal monitoring belt.      Physical Exam  Constitutional: She is oriented to person, place, and time. She appears well-developed and well-nourished. She appears distressed.  HENT:  Head: Normocephalic.  Neck: Normal range of motion. Neck supple.  Cardiovascular: Normal rate, regular rhythm and normal heart sounds.   Respiratory: Effort normal and breath sounds normal.  GI: Soft. There is no tenderness.  Genitourinary: No bleeding around the vagina. Vaginal discharge (yellowish meconium seen on perineum) found.  Musculoskeletal: Normal range of motion.  Neurological: She is alert and oriented to person, place, and time.  Skin: Skin is warm and dry.    Cervix - 7/100/0  Prenatal labs: ABO, Rh:   Antibody:   Rubella:   RPR:    HBsAg:    HIV: NON REACTIVE (08/20 1502)  GBS:     Assessment/Plan: 37yo [id number] at [redacted]w[redacted]d wks IUP  Active Labor  GBS negative Non-Particulate Meconium Stained Fluid  Plan: Admit to [facility]  [facility]   [facility] 01/21/2013, 2:27 PM

## 2013-01-21 NOTE — Lactation Note (Signed)
This note was copied from the chart of Tanya Maxwell. Lactation Consultation Note  Patient Name: Tanya Maxwell [id number] Date: 01/21/2013 Reason for consult: Initial assessment of this experienced second-time mom who nursed first child for one year and states her newborn is nursing well so far.  Baby has just completed a 30 minute feeding and is asleep at breast, STS and not showing hunger cues at this time.  Mom says she knows how to hand express and is aware of benefits of STS.  LC encouraged STS and cue feedings, encouraged review of Baby and Me pp 14 and 20-25 for STS and BF information. LC provided [employer] brochure and reviewed [facility] services and list of community and web site resources.    Maternal Data Formula Feeding for Exclusion: No Infant to breast within first hour of birth: Yes Has patient been taught Hand Expression?: Yes (experienced mom; states she knows how to express milk by hand) Does the patient have breastfeeding experience prior to this delivery?: Yes  Feeding Feeding Type: Breast Fed Length of feed: 20 min  LATCH Score/Interventions Latch: Grasps breast easily, tongue down, lips flanged, rhythmical sucking.  Audible Swallowing: A few with stimulation Intervention(s): Skin to skin  Type of Nipple: Everted at rest and after stimulation  Comfort (Breast/Nipple): Soft / non-tender     Hold (Positioning): No assistance needed to correctly position infant at breast.  LATCH Score: 9 (previous breastfeeding but initial LATCH score=10 after delivery)  Lactation Tools Discussed/Used   STS, hand expression, cue feedings  Consult Status Consult Status: Follow-up Date: 01/22/13 Follow-up type: In-patient    Warrick Parisian [facility] 01/21/2013, 7:58 PM

## 2013-01-21 NOTE — MAU Note (Signed)
Pt is postdates and is experiencing U/C every 2 minutes.  ROM approx at 1330 with mec. Stained fluid.  Pt met in car bay area screaming and transferred to Room 6 by wheelchair.  Gypsy Lore, NP and Threasa Heads, CNM at bedside to examine.

## 2013-01-22 LAB — CBC
HCT: 31.3 % — ABNORMAL LOW (ref 36.0–46.0)
MCV: 86 fL (ref 78.0–100.0)
Platelets: 155 10*3/uL (ref 150–400)
RBC: 3.64 MIL/uL — ABNORMAL LOW (ref 3.87–5.11)
WBC: 10.6 10*3/uL — ABNORMAL HIGH (ref 4.0–10.5)

## 2013-01-22 NOTE — Progress Notes (Signed)
Post Partum Day 1 Subjective: no complaints, up ad lib, voiding, tolerating PO and + flatus  Objective: Blood pressure 107/71, pulse 56, temperature 97.9 F (36.6 C), temperature source Oral, resp. rate 18, height 5\' 5"  (1.651 m), weight 75.751 kg (167 lb), last menstrual period 04/10/2012, SpO2 97.00%, unknown if currently breastfeeding.  Physical Exam:  General: alert, cooperative, appears stated age and no distress Lochia: appropriate Uterine Fundus: firm DVT Evaluation: No evidence of DVT seen on physical exam. Negative Homan's sign. No cords or calf tenderness. No significant calf/ankle edema.   Recent Labs  01/21/13 1630 01/22/13 0550  HGB 12.5 10.9*  HCT 35.6* 31.3*    Assessment/Plan: Desires early Discharge home, Breastfeeding and Contraception undecided - mirena vs vasectomy   LOS: 1 day   Tevon Berhane RYAN 01/22/2013, 7:22 AM

## 2013-01-22 NOTE — Discharge Summary (Signed)
Obstetric Discharge Summary Reason for Admission: rupture of membranes Prenatal Procedures: none Intrapartum Procedures: spontaneous vaginal delivery Postpartum Procedures: none Complications-Operative and Postpartum: 1st degree perineal laceration Did not require repair Hemoglobin  Date Value Range Status  01/22/2013 10.9* 12.0 - 15.0 g/dL Final     HCT  Date Value Range Status  01/22/2013 31.3* 36.0 - 46.0 % Final    Physical Exam:  General: alert, cooperative, appears stated age and no distress Lochia: appropriate Uterine Fundus: firm DVT Evaluation: No evidence of DVT seen on physical exam. Negative Homan's sign. No cords or calf tenderness. No significant calf/ankle edema.  Discharge Diagnoses: Term Pregnancy-delivered  Discharge Information: Date: 01/23/2013 Activity: pelvic rest Diet: routine Medications: PNV and Ibuprofen Condition: stable Instructions: refer to practice specific booklet Discharge to: home Follow-up Information   Follow up with [facility]. Schedule an appointment as soon as possible for a visit in 4 weeks.   Specialty:  Obstetrics and Gynecology   Contact information:   [address] [place] [address] [phone]      Newborn Data: Live born female  Birth Weight: 8 lb 1 oz (3657 g) APGAR: 9, 9  Home with mother.  Tanya Maxwell is a 37 y.o. [id number] at [redacted]w[redacted]d presented after SROM with contractions. Pt progressed through labor and had a vaginal delivery without analgesia. 1st degree tear not repaired as it was hemostatic. Pt is undecided on birth control: Mirena vs vasectomy. Pt is breast feeding successfully prior to discharge. F/u in 4 weeks.   Tanya Maxwell 01/23/2013, 7:43 AM

## 2013-01-23 ENCOUNTER — Inpatient Hospital Stay (HOSPITAL_COMMUNITY): Admission: RE | Admit: 2013-01-23 | Payer: Non-veteran care | Source: Ambulatory Visit

## 2013-01-23 MED ORDER — IBUPROFEN 600 MG PO TABS: 600.0000 mg | ORAL_TABLET | Freq: Four times a day (QID) | ORAL | Status: AC

## 2013-01-23 NOTE — Discharge Summary (Addendum)
I spoke with and examined patient and agree with resident's note and plan of care.  Tawana Scale, MD OB Fellow 01/23/2013 9:24 AM

## 2013-01-23 NOTE — Progress Notes (Signed)

## 2013-01-24 ENCOUNTER — Inpatient Hospital Stay (HOSPITAL_COMMUNITY): Payer: Non-veteran care

## 2013-01-24 NOTE — Discharge Summary (Signed)
Attestation of Attending Supervision of Advanced Practitioner: Evaluation and management procedures were performed by the PA/NP/CNM/OB Fellow under my supervision/collaboration. Chart reviewed and agree with management and plan.  Avrey Hyser V 01/24/2013 10:16 PM   

## 2013-01-28 NOTE — H&P (Signed)
Attestation of Attending Supervision of Advanced Practitioner (CNM/NP): Evaluation and management procedures were performed by the Advanced Practitioner under my supervision and collaboration.  I have reviewed the Advanced Practitioner's note and chart, and I agree with the management and plan.  HARRAWAY-SMITH, Lithzy Bernard 2:37 PM

## 2013-03-10 ENCOUNTER — Ambulatory Visit (INDEPENDENT_AMBULATORY_CARE_PROVIDER_SITE_OTHER): Payer: Non-veteran care | Admitting: Obstetrics & Gynecology

## 2013-03-10 ENCOUNTER — Encounter: Payer: Self-pay | Admitting: Obstetrics & Gynecology

## 2013-03-10 DIAGNOSIS — Z309 Encounter for contraceptive management, unspecified: Secondary | ICD-10-CM

## 2013-03-10 MED ORDER — ETONOGESTREL-ETHINYL ESTRADIOL 0.12-0.015 MG/24HR VA RING: VAGINAL_RING | VAGINAL | Status: DC

## 2013-03-10 MED ORDER — ETONOGESTREL-ETHINYL ESTRADIOL 0.12-0.015 MG/24HR VA RING: VAGINAL_RING | VAGINAL | Status: AC

## 2013-03-10 NOTE — Patient Instructions (Signed)

## 2013-03-10 NOTE — Progress Notes (Signed)
  Subjective:     Tanya Maxwell is a 38 y.o. 663P2012 female who presents for a postpartum visit. She is 6 weeks postpartum following a spontaneous vaginal delivery. I have fully reviewed the prenatal and intrapartum course. The delivery was at 40.6 gestational weeks. Outcome: spontaneous vaginal delivery. Anesthesia: none. Postpartum course has been uncomplicated. Baby's course has been uncomplicated. Baby is feeding by breast. Bleeding no bleeding. Bowel function is normal. Bladder function is normal. Patient is sexually active, no issues. Contraception method is NuvaRing vaginal inserts. Postpartum depression screening: negative.  The following portions of the patient's history were reviewed and updated as appropriate: allergies, current medications, past family history, past medical history, past social history, past surgical history and problem list.  Normal pap, negative HRHPV on 07/17/12.  Review of Systems Pertinent items are noted in HPI.   Objective:    BP 119/79  Pulse 74  Ht 5\' 5"  (1.651 m)  Wt 148 lb (67.132 kg)  BMI 24.63 kg/m2  Breastfeeding? Yes  General:  alert and no distress   Breasts:  inspection negative, no nipple discharge or bleeding, no masses or nodularity palpable  Abdomen: soft, non-tender; bowel sounds normal; no masses,  no organomegaly   Vulva:  normal  Vagina: not evaluated  Cervix:  multiparous appearance  Corpus: normal size, contour, position, consistency, mobility, non-tender  Adnexa:  normal adnexa and no mass, fullness, tenderness  Rectal Exam: Not performed.        Assessment:     Normal postpartum exam. Pap smear not done at today's visit.   Plan:   1. Contraception: NuvaRing vaginal inserts 2. Follow up as needed.     Jaynie CollinsUGONNA  Dyllan Hughett, MD, FACOG Attending Obstetrician & Gynecologist Faculty Practice, [facility]

## 2013-05-28 ENCOUNTER — Encounter: Payer: Self-pay | Admitting: *Deleted

## 2013-12-28 ENCOUNTER — Encounter: Payer: Self-pay | Admitting: Obstetrics & Gynecology

## 2014-05-04 IMAGING — US US OB DETAIL+14 WK
1 series · 12 of 28 positions shown · non-contrast
Comparison: none

[Series 1: us ob detail+14 wk · 75 acquisitions, 12 frames shown]
[im 3/75]
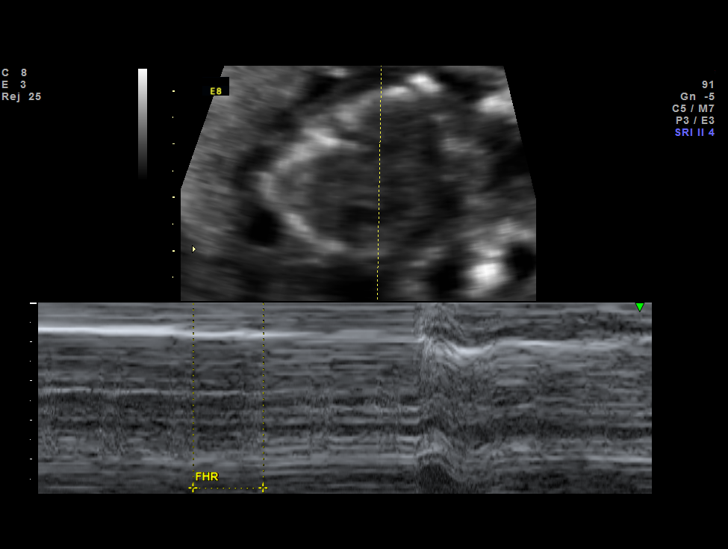
[im 9/75]
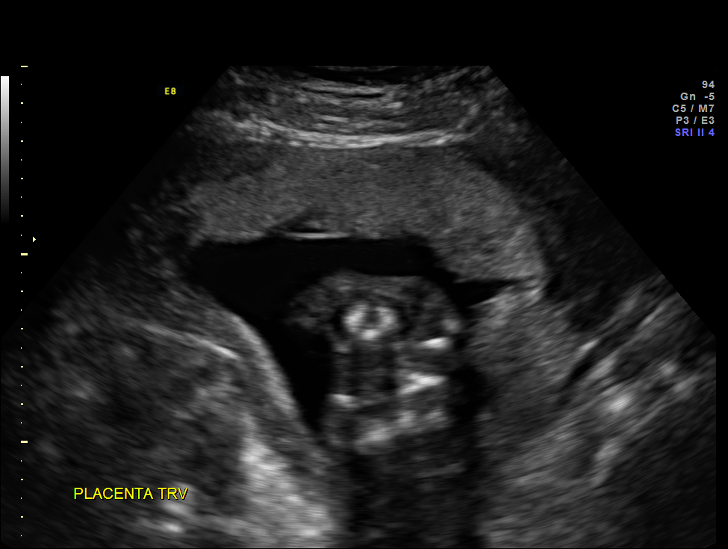
[im 14/75]
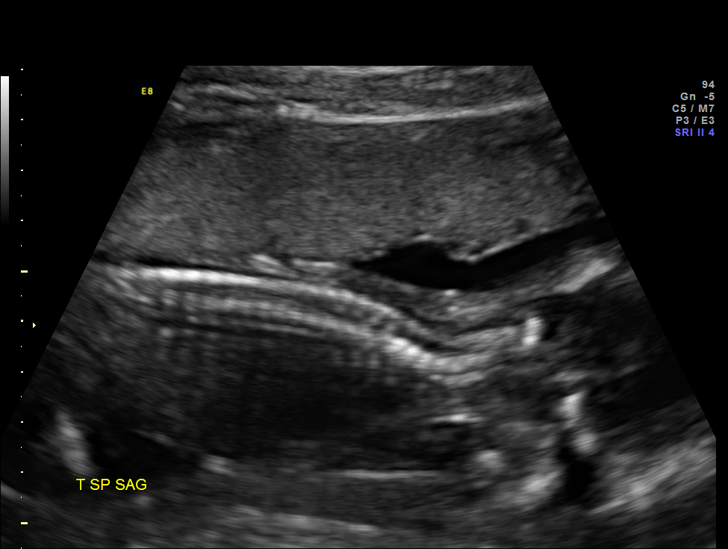
[im 22/75]
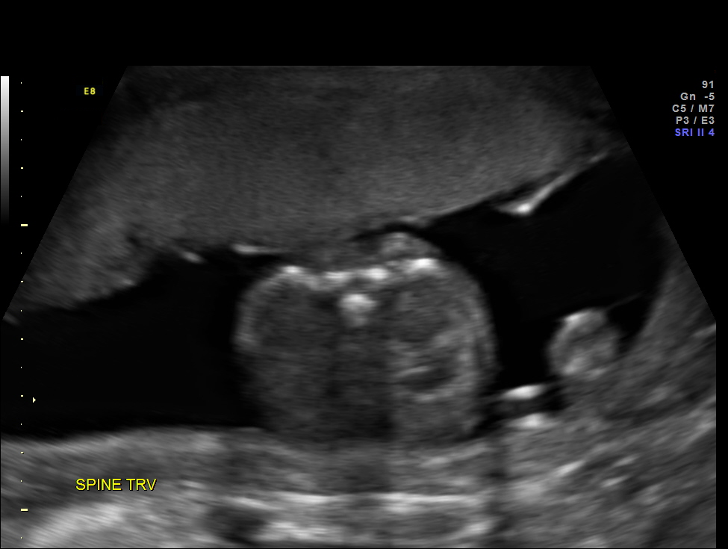
[im 28/75]
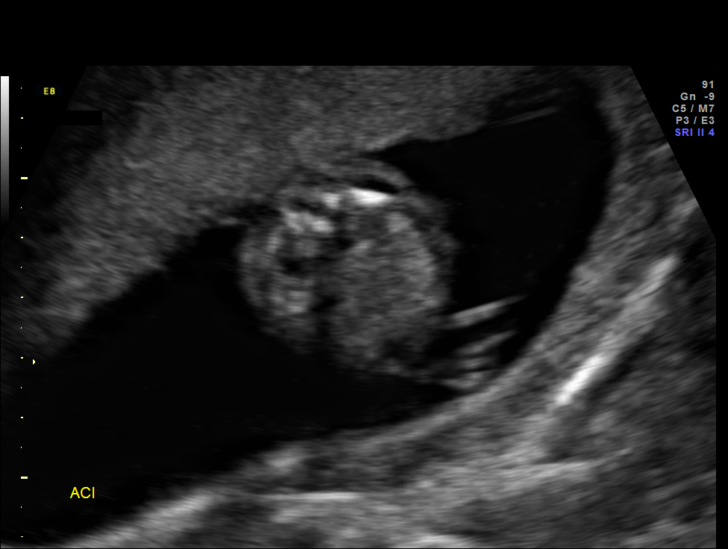
[im 33/75]
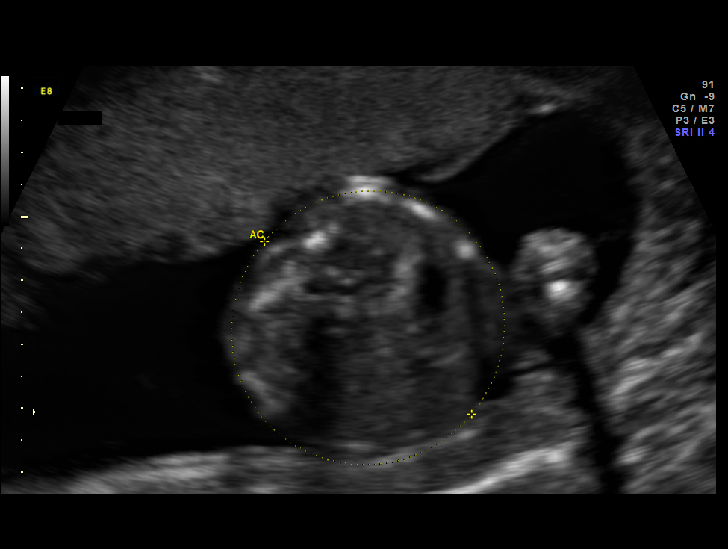
[im 42/75]
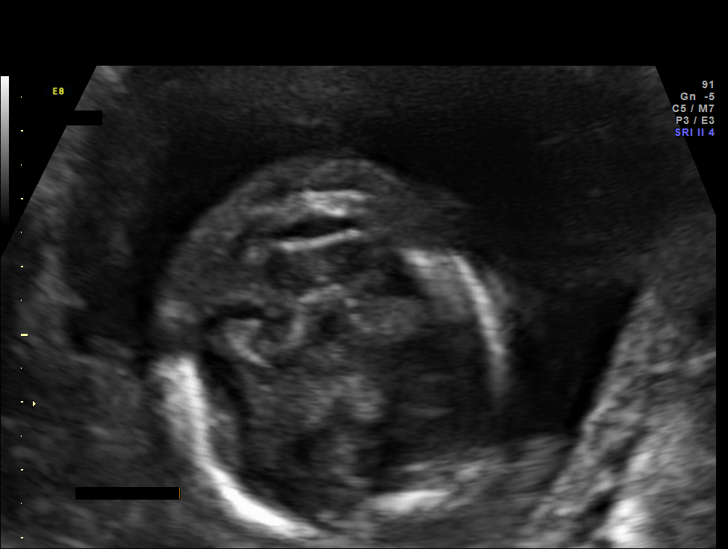
[im 47/75]
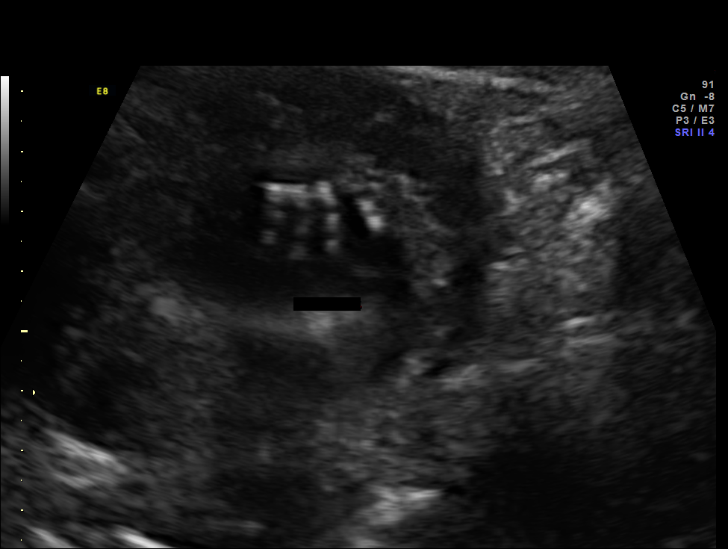
[im 53/75]
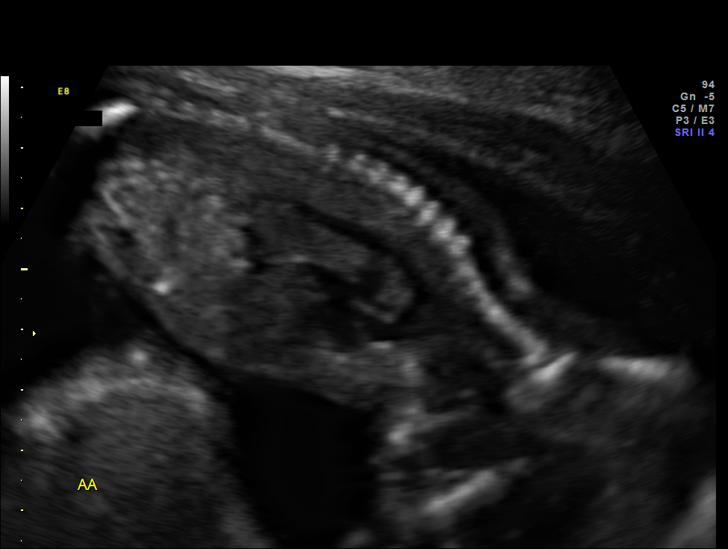
[im 61/75]
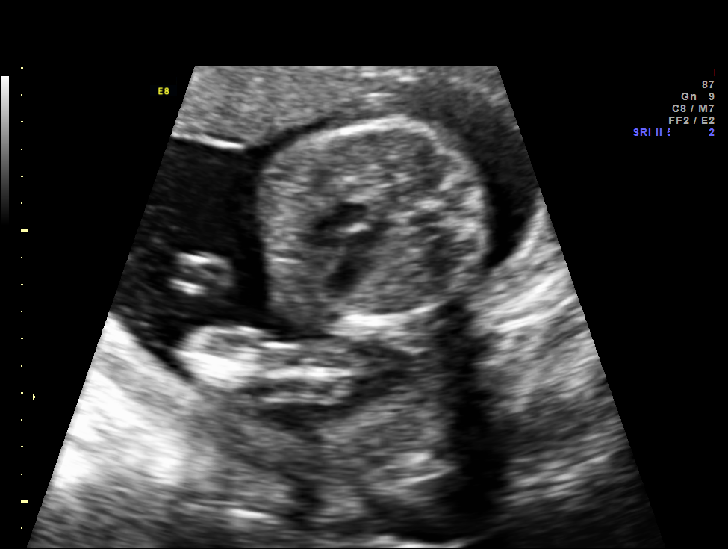
[im 66/75]
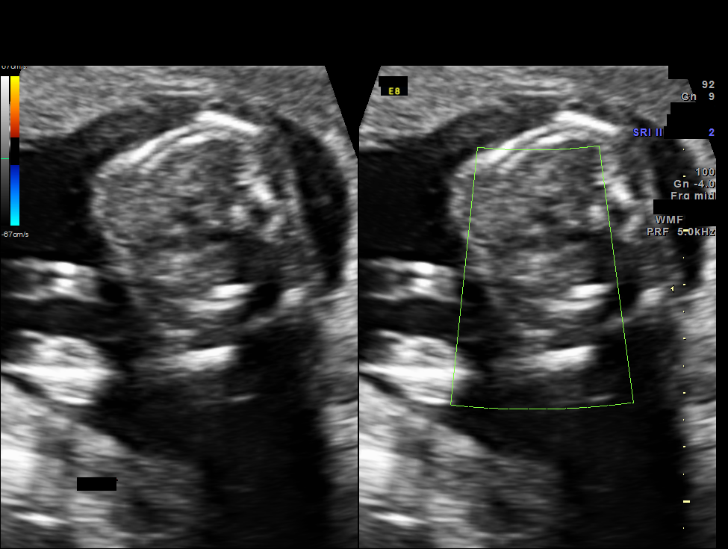
[im 72/75]
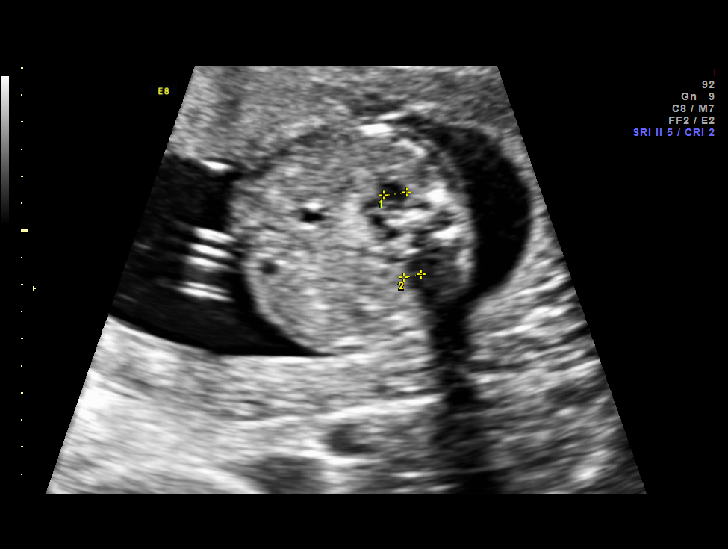

[12 of 28 positions shown; findings below may reference images not displayed]

OBSTETRICS REPORT
                      (Signed Final 08/21/2012 [DATE])

Service(s) Provided

 US OB DETAIL + 14 WK                                  76811.0
Indications

 Detailed fetal anatomic survey
 Advanced maternal age (AMA), Multigravida
Fetal Evaluation

 Num Of Fetuses:    1
 Fetal Heart Rate:  144                          bpm
 Cardiac Activity:  Observed
 Presentation:      Cephalic
 Placenta:          Anterior, above cervical os
 P. Cord            Marginal insertion
 Insertion:

 Amniotic Fluid
 AFI FV:      Subjectively within normal limits
                                             Larg Pckt:     4.6  cm
Biometry

 BPD:     45.8  mm     G. Age:  19w 6d                CI:         81.5   70 - 86
 OFD:     56.2  mm                                    FL/HC:      17.0   16.1 -

 HC:     163.4  mm     G. Age:  19w 1d       41  %    HC/AC:      1.22   1.09 -

 AC:     134.4  mm     G. Age:  19w 0d       38  %    FL/BPD:
 FL:      27.8  mm     G. Age:  18w 4d       22  %    FL/AC:      20.7   20 - 24
 HUM:     29.1  mm     G. Age:  19w 3d       59  %
 CER:     19.2  mm     G. Age:  18w 4d       35  %
 NFT:      3.8  mm

 Est. FW:     258  gm      0 lb 9 oz     38  %
Gestational Age

 LMP:           19w 1d        Date:  04/09/12                 EDD:   01/14/13
 U/S Today:     19w 1d                                        EDD:   01/14/13
 Best:          19w 1d     Det. By:  LMP  (04/09/12)          EDD:   01/14/13
Anatomy
 Cranium:          Appears normal         Aortic Arch:      Appears normal
 Fetal Cavum:      Appears normal         Ductal Arch:      Not well visualized
 Ventricles:       Appears normal         Diaphragm:        Not well visualized
 Choroid Plexus:   Appears normal         Stomach:          Appears normal, left
                                                            sided
 Cerebellum:       Appears normal         Abdomen:          Appears normal
 Posterior Fossa:  Appears normal         Abdominal Wall:   Appears nml (cord
                                                            insert, abd wall)
 Nuchal Fold:      Appears normal         Cord Vessels:     Appears normal (3
                                                            vessel cord)
 Face:             Orbits appear          Kidneys:          Bilat pyelectasis,
                   normal
                                                            Rt  4.2mm, Lt 4 mm
 Lips:             Appears normal         Bladder:          Appears normal
 Heart:            Appears normal         Spine:            Appears normal
                   (4CH, axis, and
                   situs)
 RVOT:             Appears normal         Lower             Appears normal
                                          Extremities:
 LVOT:             Appears normal         Upper             Appears normal
                                          Extremities:

 Other:  Fetus appears to be a male. Heels and 5th digit visualized.
Targeted Anatomy

 Fetal Central Nervous System
 Cisterna Magna:
Cervix Uterus Adnexa

 Cervical Length:    3.4      cm

 Cervix:       Normal appearance by transabdominal scan.

 Adnexa:     No abnormality visualized.
Impression

 Single IUP at 19 [DATE] weeks
 Bilateral renal pylectasis without calyceal dilation (Rt 4.2 mm;
 Lt 4.1 mm)
 Fetal anatomy otherwise within normal limits.  Ductal arch not
 well visualized.
 No other markers associated with aneuploidy noted
 Normal amniotic fluid volume

 NIPS (cell free fetal DNA) - low risk for aneuploidy
Recommendations

 Recommend follow-up ultrasound examination at approx 30
 weeks to reevaluate the fetal kidneys.

## 2019-11-17 ENCOUNTER — Encounter: Admit: 2019-11-17 | Payer: PRIVATE HEALTH INSURANCE | Attending: Vascular and Interventional Radiology

## 2020-12-22 ENCOUNTER — Encounter: Admit: 2020-12-22 | Payer: PRIVATE HEALTH INSURANCE | Attending: Vascular and Interventional Radiology

## 2021-01-10 ENCOUNTER — Encounter: Admit: 2021-01-10 | Payer: PRIVATE HEALTH INSURANCE | Attending: Vascular and Interventional Radiology

## 2021-02-07 ENCOUNTER — Encounter: Admit: 2021-02-07 | Payer: PRIVATE HEALTH INSURANCE | Attending: Vascular and Interventional Radiology

## 2021-04-18 ENCOUNTER — Telehealth: Admit: 2021-04-18 | Payer: PRIVATE HEALTH INSURANCE

## 2021-04-18 ENCOUNTER — Encounter: Admit: 2021-04-18 | Payer: PRIVATE HEALTH INSURANCE

## 2021-04-18 NOTE — Telephone Encounter
Referral noted in queue.  Call placed to patient to discuss living donation.  Voicemail reached, message left introducing myself and the reason for the call.  Call back number provided to further discuss interest regarding living liver donation.  Introductory email with contact information also sent.

## 2021-04-20 ENCOUNTER — Telehealth: Admit: 2021-04-20 | Payer: PRIVATE HEALTH INSURANCE

## 2021-04-20 ENCOUNTER — Encounter: Admit: 2021-04-20 | Payer: PRIVATE HEALTH INSURANCE

## 2021-04-20 MED ORDER — IBUPROFEN 200 MG TABLET
200 mg | Freq: Four times a day (QID) | ORAL | Status: AC | PRN
Start: 2021-04-20 — End: ?

## 2021-04-21 ENCOUNTER — Telehealth: Admit: 2021-04-21 | Payer: PRIVATE HEALTH INSURANCE

## 2021-04-21 NOTE — Telephone Encounter
Living Liver Donor Initial Referral Note:Holly Melendez is a 46 y.o. female who has contacted our referral line voluntarily in order to be considered as a potential living liver donor.ABO: A+ Not on FileNo past medical history on file.No family history on file.DONOR EVALUATION: REGISTRATION: [phone] Screening and Hypercoagulability Evaluation Donor Name: Holly Melendez  46 y.o. Sex: female Height: 5'5    Weight:  155 lb     BMI: 25.79Ethnic Background: Medical Insurance: Yes - [place] coverageABO: A+ Relationship to recipient: cousinEmployed: Entrepreneur	How Long: Marital Status: marriedChildren:  4 (2 biological)Place of Birth: [place] - Yes   UNOS Listed: YesDirected donation: Yes Date of Birth: 1977/02/26Social Security Number: (Not on file)[address] [place] [address] Phone: [phone] outside [place], do yo have a VISA?    Surgical History (Including any anesthesia issues):Past Surgical History: Procedure Laterality Date ? BREAST REDUCTION SURGERY   ? LIPOSUCTION   Anesthesia concerns: No  MEDICAL HISTORY: FAMILY MEDICAL HISTORY: ?	Hypercoagulability Screening:History of venous thrombosis? (DVT, PE, Budd-Chiari syndrome, mesenteric and portal vein thrombus, retinal vein thrombosis): No History of arterial thrombosis (MI, CVA, TIA): NoHistory of post-operative thromboembolic complications: NoHistory of catheter-related thrombosis: NoHistory of autoimmune disorders: NoCurrently taking hormonal medication (BCP, hormone replacement, hormone treatment, Tamoxifen or Raloxifene): NoHistory of pregnancy loss (2 or more in first trimester,1 in second or third trimester or stillborn):  Yes - therapeutic abortionPregnancy related complications (IUGR, pre-eclampsia or abruptio placentae: NoProblems with reproduction/infertility:  No?	Cardiac: No?	Hypertension: No ?	Pulmonary (Sleep Apnea): No?	Endocrine: No?	Diabetes: NoIf yes, treatment: ?	GI: No?	Integumentary: Yes - tattoos done professionally?	Vascular: No?	Muscular Skeletal: No?	Kidney: No?	Blood Transfusion (Dates): No?	Exposure to TB: No?	Cancer: No?	Recent signs of infection: NoHave you had COVID 19: YesWhen? End of 2020/beginning of 2021?	COVID immunization: No - will not consider immunizationBooster:  No?	Medication Allergies: Yes - Tryptophan?	Food Allergies: Yes - KiwiHistory of Psychiatric Illness, last 6 mo. (obtain release): ?	-anxiety d/o : Yes?	-depressive d/o : Yes - treated - not current, no medication. also diagnosed with ADD?	-ptsd : No?	-self-injurious behavior : No?	-suicide attempt: NoProvider Name:  VA systemProvider contact: ?	Mother Age:  deceased?	Health Status/Illness: uterine cancer, DM, stroke, heart disease, MI?	Father Age: 65?	Health Status/Illness:  HTN (not consistent issue), neuropathy, depression?	Maternal Grandmother Age: deceased?	 Health Status/Illness: ?	Paternal Grandmother Age: deceased?	Health Status/Illness: old age?	Maternal Grandfather Age: deceased?	Health Status/Illness:?	Paternal Grandfather: deceased?	Health Status/Illness: old age?	Sibling #1 Age brother?	Health Status/Illness: none?	Sibling #2 Age sister ?	 Health Status/Illness: depression/anxiety?	Sibling #3 Age brother?	Health Status/Illness: Chiari malformationOther History/Information/Comments:Migraines SOCIAL HISTORY HEALTH SCREENING ?	 Tobacco (if yes, how much): No ?	Mammogram: not done - willing to do ?	Use of Other Tobacco Products (if yes, how much): No ?	Date of last colonoscopy (if over 50): no -  ?	ETOH (if yes, how much): Yes - once in a blue moon  ?	PAP: up to date 2/14//2022 ?	Illegal or Recreational Drug (if yes, how much): No PCP Name: Dr. Jacki Cones RECHHOLTZ ?	Sexual history: married no increased risk Last seen date: 2019Dental cleaning: up to date ?	Travel Outside the country within the last 12 months (for work or vacation):  Yes - [place] ?	Travel within the [place] in last year: Yes  [place]  MEDICATIONS (PRESCRIPTION, OVER THE COUNTER, HERBS, SUPPLEMENTS)  Current Outpatient Medications on File Prior to Visit Medication Sig Dispense Refill ? ibuprofen (ADVIL,MOTRIN) 200 mg tablet Take 1 tablet (200 mg total) by mouth every 6 (six) hours as needed.   No current facility-administered medications on file prior to visit.  The following topics were discussed at this time: Motivation to donate Concept of Donor Advocacy and Donor Advocate Team Members Evaluation process: EKG, Echocardiogram, Cardiac Clearance,  Hepatology Clearance, Surgical Clearance, Psychiatry Clearance, Social Work Clearance, MRI/MRCP and ILDA Consult, coordinator consultMaintaining donor confidentiality Donor's ability to opt out at any point Patient and Transplant Center ResponsibilitiesFactors that make a donor a candidate or precludes candidacyTime off of work Lost IT trainer stayReturn to work Discussion regarding financial gain for organs being illegalNotes: All above topics discussed and opportunity to ask questions offered.  All questions answered.  We discussed her stand on the COVID vaccine and she is not willing to accept the vaccine.  She expressed strong feeling regarding this.  Plan:Review with MD - specifically regarding COVID vaccine.

## 2021-04-21 NOTE — Telephone Encounter
Call placed to patient to advise that COVID vaccination is not currently a mandate, but remains strongly encouraged.  She was advised that if she wished to proceed int he process to let us know and we can abide by her wishes.  She noted she does not have time currently to talk but is interested in proceeding.  She was advised to reach out to this coordinator at her convenience and we could discuss next steps.  She verbalized understanding.

## 2021-05-08 ENCOUNTER — Telehealth: Admit: 2021-05-08 | Payer: PRIVATE HEALTH INSURANCE

## 2021-05-08 NOTE — Telephone Encounter
Call placed to patient to review her interest in live liver donation and her ability to proceed in the process.  Voicemail reached, message left identifying myself and the reason for my call.  Message left noting that this coordinator is calling to review her interest in proceeding in the process.  Call back requested, contact information provided.  Email sent to request call back as well.

## 2021-05-16 ENCOUNTER — Telehealth: Admit: 2021-05-16 | Payer: PRIVATE HEALTH INSURANCE

## 2021-05-16 NOTE — Telephone Encounter
Called patient to follow up her interest in live liver donation and if she is interested to proceed in the process.  Unable to reach her, left message introducing myself and the reason for my call. Call back requested and contact information provided. Email sent to patient as well.

## 2021-05-23 ENCOUNTER — Encounter: Admit: 2021-05-23 | Payer: PRIVATE HEALTH INSURANCE

## 2021-05-23 ENCOUNTER — Telehealth: Admit: 2021-05-23 | Payer: PRIVATE HEALTH INSURANCE

## 2021-05-23 DIAGNOSIS — Z005 Encounter for examination of potential donor of organ and tissue: Secondary | ICD-10-CM

## 2021-05-23 NOTE — Telephone Encounter
Left message on patient's VM that her lab orders are available at [facility], she can go to [facility] near her area. Requested a call back once blood works are completed or if she has any questions /concerns. Contact number provided.

## 2021-05-23 NOTE — Telephone Encounter
Received call from patient, verbalized that she is still interested to move forward with liver living donation. Instructions for initial screening labs given to patient, she verbalized understanding of the information. She stated to notify her once the orders are signed. She will be going to [facility].

## 2021-06-01 ENCOUNTER — Encounter: Admit: 2021-06-01 | Payer: PRIVATE HEALTH INSURANCE | Attending: Vascular and Interventional Radiology

## 2021-06-01 ENCOUNTER — Telehealth: Admit: 2021-06-01 | Payer: PRIVATE HEALTH INSURANCE

## 2021-06-01 NOTE — Telephone Encounter
Turn call received from patient.  She notes she is willing to have the service come to her to draw the labs.  She thought this is a good and is happy to make things easier.  She was informed that she willhear form the company in the next couple of days and it will be through a phone number that is not one she will identify.  She verbalized understanding of this information.

## 2021-06-01 NOTE — Telephone Encounter
Call placed to patient to follow up on screening labs.  Voicemail reached, message left identifying myself and the reason for my call.  Message left noting that we have a service that can reach out to her to arrange a time to come and draw her blood if that would be more convenient for her.  Message left requesting return call.  Contact number provided.

## 2021-06-06 ENCOUNTER — Encounter: Admit: 2021-06-06 | Payer: PRIVATE HEALTH INSURANCE

## 2021-06-08 ENCOUNTER — Encounter: Admit: 2021-06-08 | Payer: PRIVATE HEALTH INSURANCE | Attending: Clinical

## 2021-06-12 ENCOUNTER — Encounter: Admit: 2021-06-12 | Payer: PRIVATE HEALTH INSURANCE

## 2021-06-12 DIAGNOSIS — Z332 Encounter for elective termination of pregnancy: Secondary | ICD-10-CM

## 2021-06-12 DIAGNOSIS — F988 Other specified behavioral and emotional disorders with onset usually occurring in childhood and adolescence: Secondary | ICD-10-CM

## 2021-06-12 DIAGNOSIS — F419 Anxiety disorder, unspecified: Secondary | ICD-10-CM

## 2021-06-12 DIAGNOSIS — U071 COVID-19: Secondary | ICD-10-CM

## 2021-06-12 DIAGNOSIS — F32A Depression: Secondary | ICD-10-CM

## 2021-06-13 ENCOUNTER — Telehealth: Admit: 2021-06-13 | Payer: PRIVATE HEALTH INSURANCE

## 2021-06-13 NOTE — Telephone Encounter
Called Holly Melendez about her screening lab work.  She notes she was to have it done today, however the examiner was expecting a second package from [facility] with tubes to be drawn.  He postponed the testing due to this.  Holly Melendez was assured that this would be corrected and this coordinator was going to reach out to the representative regarding this.  She was assured that she should be able to have this done soon.  She verbalized understanding.

## 2021-06-29 ENCOUNTER — Telehealth: Admit: 2021-06-29 | Payer: PRIVATE HEALTH INSURANCE

## 2021-06-29 NOTE — Telephone Encounter
Call placed to the patient to inquire about the whether the lab draw has been rescheduled.  She notes it has not, but stated that she has received calls inquiring if she has received her kit.  She confirmed this with a representative. She was thanked for her patience and assured that will be escalated with the hope of having an appointment scheduled.  Holly Melendez verbalized understanding. Message sent excalating this case, requesting update on the appointment.

## 2021-08-11 ENCOUNTER — Telehealth: Admit: 2021-08-11 | Payer: PRIVATE HEALTH INSURANCE

## 2021-08-11 NOTE — Telephone Encounter
Call placed to patient to discuss labs and her interest in the process.  She notes that she was speaking with her husband and discussing that this process seems more difficult than it should be.  Apologies were expressed that the lab had not been able to connect with her to have her labs done.  She acknowledged that she was out of town for 5 days during the process but has been back and has not been contacted.  We discussed her interest and he thoughts on the process.  She notes she currently has a lot going on and believed that stepping back for now to manage her own things is best.  Holly Melendez was advised that her referral will be closed but is able to be reopened very easily should she wish to re-engage and proceed.  She verbalized understanding.  Orders cancelled.

## 2024-02-26 ENCOUNTER — Ambulatory Visit
Admission: EM | Admit: 2024-02-26 | Discharge: 2024-02-26 | Disposition: A | Discharge status: Home / Self Care | Attending: Emergency Medicine | Admitting: Emergency Medicine

## 2024-02-26 DIAGNOSIS — N898 Other specified noninflammatory disorders of vagina: Secondary | ICD-10-CM | POA: Diagnosis not present

## 2024-02-26 NOTE — ED Provider Notes (Signed)
 " [facility]    CSN: [id number] Arrival date & time: 02/26/24  1230      History   Chief Complaint Chief Complaint  Patient presents with   Vaginal Itching    HPI Tanya Maxwell is a 48 y.o. female.   HPI  48 year old female with past medical history significant for migraine headaches with aura and anxiety presents for evaluation of 6 days with a vaginal discharge and itching.  She reports that her discharge began after she and her husband had intercourse.  He is uncircumcised.  She also reports that there is a lot of friction.  She describes it as a thick white without odor.  No urinary symptoms.  Past Medical History:  Diagnosis Date   Migraines    since 1996    Patient Active Problem List   Diagnosis Date Noted   Contraception management 03/10/2013   Migraine with aura 12/26/2010   Anxiety 12/26/2010    Past Surgical History:  Procedure Laterality Date   LIPOSUCTION  01/2010    OB History     Gravida  3   Para  2   Term  2   Preterm  0   AB  1   Living  2      SAB  0   IAB  1   Ectopic  0   Multiple  0   Live Births  2            Home Medications    Prior to Admission medications  Medication Sig Start Date End Date Taking? Authorizing Provider  etonogestrel -ethinyl estradiol  (NUVARING) 0.12-0.015 MG/24HR vaginal ring Insert vaginally and leave in place for 3 consecutive weeks, then remove for 1 week. 03/10/13   Anyanwu, Ugonna A, MD  ibuprofen  (ADVIL ,MOTRIN ) 600 MG tablet Take 1 tablet (600 mg total) by mouth every 6 (six) hours. 01/23/13   Bryn Bernardino NOVAK, MD  Prenatal Vit-Fe Fumarate-FA (PRENATAL MULTIVITAMIN) TABS Take 1 tablet by mouth daily.    [provider]    Family History Family History  Problem Relation Age of Onset   Heart disease Mother    Diabetes Mother    Hypertension Mother    Stroke Mother    Alcohol abuse Father    Cancer Maternal Uncle        lymphoma   Hypertension Paternal  Grandmother    Gout Paternal Grandmother    Asthma Paternal Grandmother    Anesthesia problems Neg Hx     Social History Social History[1]   Allergies   Darunavir, Sulfa antibiotics, Frovatriptan, and Sumatriptan    Review of Systems Review of Systems  Genitourinary:  Positive for vaginal discharge and vaginal pain. Negative for dysuria, frequency, hematuria and urgency.     Physical Exam Triage Vital Signs ED Triage Vitals  Encounter Vitals Group     BP      Girls Systolic BP Percentile      Girls Diastolic BP Percentile      Boys Systolic BP Percentile      Boys Diastolic BP Percentile      Pulse      Resp      Temp      Temp src      SpO2      Weight      Height      Head Circumference      Peak Flow      Pain Score      Pain Loc  Pain Education      Exclude from Growth Chart    No data found.  Updated Vital Signs BP 117/83 (BP Location: Right Arm)   Pulse 81   Temp 98.4 F (36.9 C) (Oral)   Resp 16   Wt 155 lb (70.3 kg)   LMP 02/07/2024   SpO2 97%   Visual Acuity Right Eye Distance:   Left Eye Distance:   Bilateral Distance:    Right Eye Near:   Left Eye Near:    Bilateral Near:     Physical Exam Vitals and nursing note reviewed.  Constitutional:      Appearance: Normal appearance. She is not ill-appearing.  HENT:     Head: Normocephalic and atraumatic.  Skin:    General: Skin is warm and dry.     Capillary Refill: Capillary refill takes less than 2 seconds.     Findings: No rash.  Neurological:     General: No focal deficit present.     Mental Status: She is alert and oriented to person, place, and time.      UC Treatments / Results  Labs (all labs ordered are listed, but only abnormal results are displayed) Labs Reviewed  CERVICOVAGINAL ANCILLARY ONLY    EKG   Radiology No results found.  Procedures Procedures (including critical care time)  Medications Ordered in UC Medications - No data to display  Initial  Impression / Assessment and Plan / UC Course  I have reviewed the triage vital signs and the nursing notes.  Pertinent labs & imaging results that were available during my care of the patient were reviewed by me and considered in my medical decision making (see chart for details).   Patient is a nontoxic-appearing 48 year old female presenting for evaluation of 6 days worth of vaginal itching and vaginal discharge as outlined in HPI above.  As stated above, the symptoms developed after she and her husband had intercourse.  She reports that there was a lot of friction during intercourse and also that her husband is uncircumcised.  She is concerned for possible yeast infection.  No urinary symptoms.  I asked if she was concerned about STIs and she said she was not concerned but it would be okay to test.  I will order a cytology swab to assess for gonorrhea, chlamydia, trichomonas, BV, and yeast.  I will hold on therapy until after the test results are back.   Final Clinical Impressions(s) / UC Diagnoses   Final diagnoses:  Vaginal discharge     Discharge Instructions      Discussed, the test results will be back either later today or tomorrow.  If you test positive for any infection you will be contacted by phone and treatment options will be provided.  If your results are negative they will treat your MyChart.     ED Prescriptions   None    PDMP not reviewed this encounter.    [1]  Social History Tobacco Use   Smoking status: Never   Smokeless tobacco: Never  Substance Use Topics   Alcohol use: No   Drug use: No     Bernardino Ditch, NP 02/26/24 1340  "

## 2024-02-26 NOTE — Discharge Instructions (Addendum)
 Discussed, the test results will be back either later today or tomorrow.  If you test positive for any infection you will be contacted by phone and treatment options will be provided.  If your results are negative they will treat your MyChart.

## 2024-02-26 NOTE — ED Triage Notes (Signed)
 Sx x 6 days  Vaginal discharge  Vaginal itching  Std check.

## 2024-02-28 ENCOUNTER — Ambulatory Visit (HOSPITAL_COMMUNITY): Payer: Self-pay

## 2024-02-28 LAB — CERVICOVAGINAL ANCILLARY ONLY
Bacterial Vaginitis (gardnerella): NEGATIVE
Candida Glabrata: NEGATIVE
Candida Vaginitis: POSITIVE — AB
Chlamydia: NEGATIVE
Comment: NEGATIVE
Comment: NEGATIVE
Comment: NEGATIVE
Comment: NEGATIVE
Comment: NEGATIVE
Comment: NORMAL
Neisseria Gonorrhea: NEGATIVE
Trichomonas: NEGATIVE

## 2024-02-28 MED ORDER — FLUCONAZOLE 150 MG PO TABS: 150.0000 mg | ORAL_TABLET | Freq: Once | ORAL | 0 refills | Status: AC
